# Patient Record
Sex: Female | Born: 1965 | Race: Black or African American | Hispanic: No | Marital: Single | Smoking: Former smoker
Health system: Southern US, Community
[De-identification: ages and names within clinical notes are randomized; demographics above are authoritative.]

## PROBLEM LIST (undated history)

## (undated) DIAGNOSIS — I10 Essential (primary) hypertension: Secondary | ICD-10-CM

## (undated) DIAGNOSIS — E042 Nontoxic multinodular goiter: Secondary | ICD-10-CM

## (undated) DIAGNOSIS — E785 Hyperlipidemia, unspecified: Secondary | ICD-10-CM

## (undated) HISTORY — PX: NO PAST SURGERIES: SHX2092

## (undated) HISTORY — DX: Nontoxic multinodular goiter: E04.2

## (undated) HISTORY — PX: CERVICAL SPINE SURGERY: SHX589

---

## 2013-06-02 ENCOUNTER — Emergency Department: Payer: Self-pay | Admitting: Emergency Medicine

## 2013-06-05 LAB — BETA STREP CULTURE(ARMC)

## 2014-04-28 ENCOUNTER — Emergency Department: Payer: Self-pay | Admitting: Emergency Medicine

## 2015-11-20 DIAGNOSIS — F17200 Nicotine dependence, unspecified, uncomplicated: Secondary | ICD-10-CM | POA: Insufficient documentation

## 2015-11-20 DIAGNOSIS — D259 Leiomyoma of uterus, unspecified: Secondary | ICD-10-CM | POA: Insufficient documentation

## 2015-11-20 DIAGNOSIS — Z01419 Encounter for gynecological examination (general) (routine) without abnormal findings: Secondary | ICD-10-CM | POA: Insufficient documentation

## 2015-11-20 DIAGNOSIS — Z9851 Tubal ligation status: Secondary | ICD-10-CM | POA: Insufficient documentation

## 2015-11-28 DIAGNOSIS — A599 Trichomoniasis, unspecified: Secondary | ICD-10-CM | POA: Insufficient documentation

## 2018-11-24 ENCOUNTER — Ambulatory Visit
Admission: EM | Admit: 2018-11-24 | Discharge: 2018-11-24 | Disposition: A | Discharge status: Home / Self Care | Payer: Self-pay | Attending: Emergency Medicine | Admitting: Emergency Medicine

## 2018-11-24 ENCOUNTER — Encounter: Payer: Self-pay | Admitting: Emergency Medicine

## 2018-11-24 ENCOUNTER — Other Ambulatory Visit: Payer: Self-pay

## 2018-11-24 DIAGNOSIS — M79644 Pain in right finger(s): Secondary | ICD-10-CM

## 2018-11-24 DIAGNOSIS — G8929 Other chronic pain: Secondary | ICD-10-CM | POA: Diagnosis present

## 2018-11-24 DIAGNOSIS — G5601 Carpal tunnel syndrome, right upper limb: Secondary | ICD-10-CM | POA: Diagnosis present

## 2018-11-24 DIAGNOSIS — R030 Elevated blood-pressure reading, without diagnosis of hypertension: Secondary | ICD-10-CM | POA: Diagnosis present

## 2018-11-24 DIAGNOSIS — M65311 Trigger thumb, right thumb: Secondary | ICD-10-CM | POA: Diagnosis present

## 2018-11-24 MED ORDER — TRAMADOL HCL 50 MG PO TABS: 50.0000 mg | ORAL_TABLET | Freq: Four times a day (QID) | ORAL | 0 refills | Status: AC | PRN

## 2018-11-24 NOTE — ED Triage Notes (Signed)
Pt c/o right hand pain. Pain in located in the palm of her hand and her thumb. Started back in August but is getting worse. She has a hard time gripping objects. No known injury.

## 2018-11-24 NOTE — ED Provider Notes (Signed)
[facility]    CSN: [id number] Arrival date & time: 11/24/18  1617     History   Chief Complaint Chief Complaint  Patient presents with  . Hand Pain    right    HPI Mariah Parker is a 53 y.o. female.   Pt works at [employer] on [address] is right handed right thumb pain started in August, no improvement.Tried OTC meds intermittently without much relief.  The history is provided by the patient. No language interpreter was used.  Hand Pain  This is a chronic (~ 50months ) problem. The problem occurs constantly. The problem has not changed since onset.Pertinent negatives include no chest pain, no abdominal pain, no headaches and no shortness of breath. Exacerbated by: movement. The symptoms are relieved by rest. She has tried nothing for the symptoms.    History reviewed. No pertinent past medical history.  Patient Active Problem List   Diagnosis Date Noted  . Chronic thumb pain, right 11/24/2018  . Trigger thumb of right hand 11/24/2018  . Carpal tunnel syndrome of right wrist 11/24/2018  . Elevated blood pressure reading 11/24/2018    Past Surgical History:  Procedure Laterality Date  . NO PAST SURGERIES      OB History   No obstetric history on file.      Home Medications    Prior to Admission medications   Medication Sig Start Date End Date Taking? Authorizing Provider  traMADol (ULTRAM) 50 MG tablet Take 1 tablet (50 mg total) by mouth every 6 (six) hours as needed for up to 3 days for moderate pain or severe pain. 4/31/54 0/08/67  Blakely Gluth, Jeanett Schlein, NP    Family History Family History  Problem Relation Age of Onset  . Healthy Mother     Social History Social History   Tobacco Use  . Smoking status: Former Research scientist (life sciences)  . Smokeless tobacco: Never Used  Substance Use Topics  . Alcohol use: Not Currently  . Drug use: Not Currently     Allergies   Patient has no known allergies.   Review of Systems Review of Systems    Constitutional: Positive for activity change. Negative for fever.  HENT: Negative.   Eyes: Negative.   Respiratory: Negative for shortness of breath.   Cardiovascular: Negative for chest pain.  Gastrointestinal: Negative for abdominal pain.  Endocrine: Negative.   Genitourinary: Negative.   Musculoskeletal: Positive for arthralgias, joint swelling and myalgias. Negative for back pain and gait problem.  Skin: Negative for color change and wound.  Neurological: Negative for headaches.  Hematological: Negative.   Psychiatric/Behavioral: Negative.   All other systems reviewed and are negative.    Physical Exam Triage Vital Signs ED Triage Vitals  Enc Vitals Group     BP 11/24/18 1636 (!) 160/98     Pulse Rate 11/24/18 1636 79     Resp 11/24/18 1636 20     Temp 11/24/18 1636 97.8 F (36.6 C)     Temp Source 11/24/18 1636 Oral     SpO2 11/24/18 1636 100 %     Weight 11/24/18 1636 167 lb (75.8 kg)     Height 11/24/18 1636 5\' 6"  (1.676 m)     Head Circumference --      Peak Flow --      Pain Score 11/24/18 1635 8     Pain Loc --      Pain Edu? --      Excl. in [place]? --    No data  found.  Updated Vital Signs BP (!) 160/98 (BP Location: Left Arm)   Pulse 79   Temp 97.8 F (36.6 C) (Oral)   Resp 20   Ht 5\' 6"  (1.676 m)   Wt 167 lb (75.8 kg)   SpO2 100%   BMI 26.95 kg/m   V Physical Exam Vitals signs and nursing note reviewed.  Constitutional:      General: She is not in acute distress.    Appearance: She is well-developed. She is not ill-appearing or toxic-appearing.  HENT:     Head: Normocephalic.     Right Ear: Tympanic membrane normal.     Left Ear: Tympanic membrane normal.     Nose: Nose normal.     Mouth/Throat:     Pharynx: Uvula midline.  Eyes:     Pupils: Pupils are equal, round, and reactive to light.  Neck:     Musculoskeletal: Normal range of motion. No muscular tenderness.     Trachea: Trachea normal.     Meningeal: Brudzinski's sign and Kernig's  sign absent.  Cardiovascular:     Rate and Rhythm: Normal rate and regular rhythm.  Pulmonary:     Effort: Pulmonary effort is normal.     Breath sounds: Normal breath sounds.  Musculoskeletal:     Right shoulder: She exhibits normal range of motion, no tenderness, no bony tenderness, no swelling, no effusion, no crepitus, no deformity, no laceration, no pain, no spasm, normal pulse and normal strength.     Right hand: She exhibits tenderness, bony tenderness and swelling. She exhibits normal range of motion, normal capillary refill, no deformity and no laceration. Normal sensation noted. Normal strength noted.     Comments: Right thumb  Skin:    General: Skin is warm and dry.     Findings: No rash.  Neurological:     General: No focal deficit present.     Mental Status: She is alert and oriented to person, place, and time.     GCS: GCS eye subscore is 4. GCS verbal subscore is 5. GCS motor subscore is 6.  Psychiatric:        Attention and Perception: Attention normal.        Mood and Affect: Mood normal.        Speech: Speech normal.        Behavior: Behavior normal. Behavior is cooperative.      UC Treatments / Results  Labs (all labs ordered are listed, but only abnormal results are displayed) Labs Reviewed - No data to display  EKG None  Radiology No results found.  Procedures Procedures (including critical care time)  Medications Ordered in UC Medications - No data to display  Initial Impression / Assessment and Plan / UC Course  I have reviewed the triage vital signs and the nursing notes.  Pertinent labs & imaging results that were available during my care of the patient were reviewed by me and considered in my medical decision making (see chart for details).    Pt received thumb spica splint in UC for support. Referred to Ortho.   Final Clinical Impressions(s) / UC Diagnoses   Final diagnoses:  Chronic thumb pain, right  Trigger thumb of right hand    Carpal tunnel syndrome of right wrist  Elevated blood pressure reading     Discharge Instructions     Avoid aggravating repetitive use of right hand, may take tramadol as directed for pain if after taking 600 mg of ibuprofen every 8  hours x 3 days you are still having pain. Wear thumb spica splint for support. Follow up with Ortho for further evaluation of right thumb hand pain. Go to PCP for recheck of BP.     ED Prescriptions    Medication Sig Dispense Auth. Provider   traMADol (ULTRAM) 50 MG tablet Take 1 tablet (50 mg total) by mouth every 6 (six) hours as needed for up to 3 days for moderate pain or severe pain. 12 tablet Semir Brill, Jeanett Schlein, NP     Controlled Substance Prescriptions    Tori Milks, NP 07/15/48 1827

## 2018-11-24 NOTE — Discharge Instructions (Signed)
Avoid aggravating repetitive use of right hand, may take tramadol as directed for pain if after taking 600 mg of ibuprofen every 8 hours x 3 days you are still having pain. Wear thumb spica splint for support. Follow up with Ortho for further evaluation of right thumb hand pain. Go to PCP for recheck of BP.

## 2019-07-24 ENCOUNTER — Emergency Department: Payer: Self-pay

## 2019-07-24 ENCOUNTER — Encounter: Payer: Self-pay | Admitting: Intensive Care

## 2019-07-24 ENCOUNTER — Other Ambulatory Visit: Payer: Self-pay

## 2019-07-24 ENCOUNTER — Emergency Department
Admission: EM | Admit: 2019-07-24 | Discharge: 2019-07-24 | Disposition: A | Discharge status: Home / Self Care | Payer: Self-pay | Attending: Emergency Medicine | Admitting: Emergency Medicine

## 2019-07-24 DIAGNOSIS — M222X1 Patellofemoral disorders, right knee: Secondary | ICD-10-CM | POA: Insufficient documentation

## 2019-07-24 DIAGNOSIS — M25561 Pain in right knee: Secondary | ICD-10-CM

## 2019-07-24 DIAGNOSIS — G8929 Other chronic pain: Secondary | ICD-10-CM

## 2019-07-24 DIAGNOSIS — F17210 Nicotine dependence, cigarettes, uncomplicated: Secondary | ICD-10-CM | POA: Insufficient documentation

## 2019-07-24 DIAGNOSIS — I10 Essential (primary) hypertension: Secondary | ICD-10-CM | POA: Insufficient documentation

## 2019-07-24 MED ORDER — MELOXICAM 15 MG PO TABS: 15.0000 mg | ORAL_TABLET | Freq: Every day | ORAL | 0 refills | Status: DC

## 2019-07-24 MED ORDER — MELOXICAM 7.5 MG PO TABS
15.0000 mg | ORAL_TABLET | Freq: Once | ORAL | Status: AC
  Administered 2019-07-24: 15 mg via ORAL
  Filled 2019-07-24: qty 2

## 2019-07-24 MED ORDER — HYDROCHLOROTHIAZIDE 12.5 MG PO CAPS: 12.5000 mg | ORAL_CAPSULE | Freq: Every day | ORAL | 6 refills | Status: DC

## 2019-07-24 NOTE — ED Notes (Signed)
PA in to speak with pt about her elevated BP at discharge; discussed plan of care regarding same; pt understands new instructions from pa
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
## 2019-11-21 DIAGNOSIS — Z87891 Personal history of nicotine dependence: Secondary | ICD-10-CM | POA: Insufficient documentation

## 2020-01-05 DIAGNOSIS — R519 Headache, unspecified: Secondary | ICD-10-CM | POA: Insufficient documentation

## 2020-01-05 DIAGNOSIS — M792 Neuralgia and neuritis, unspecified: Secondary | ICD-10-CM | POA: Insufficient documentation

## 2020-02-24 DIAGNOSIS — G629 Polyneuropathy, unspecified: Secondary | ICD-10-CM | POA: Insufficient documentation

## 2020-02-25 IMAGING — DX DG KNEE COMPLETE 4+V*R*
4 series · 4 of 4 positions shown · non-contrast
Comparison: None.

CLINICAL DATA: 53-year-old female with right knee pain, no recent
injury.

EXAM:
RIGHT KNEE - COMPLETE 4+ VIEW

[knee ap]
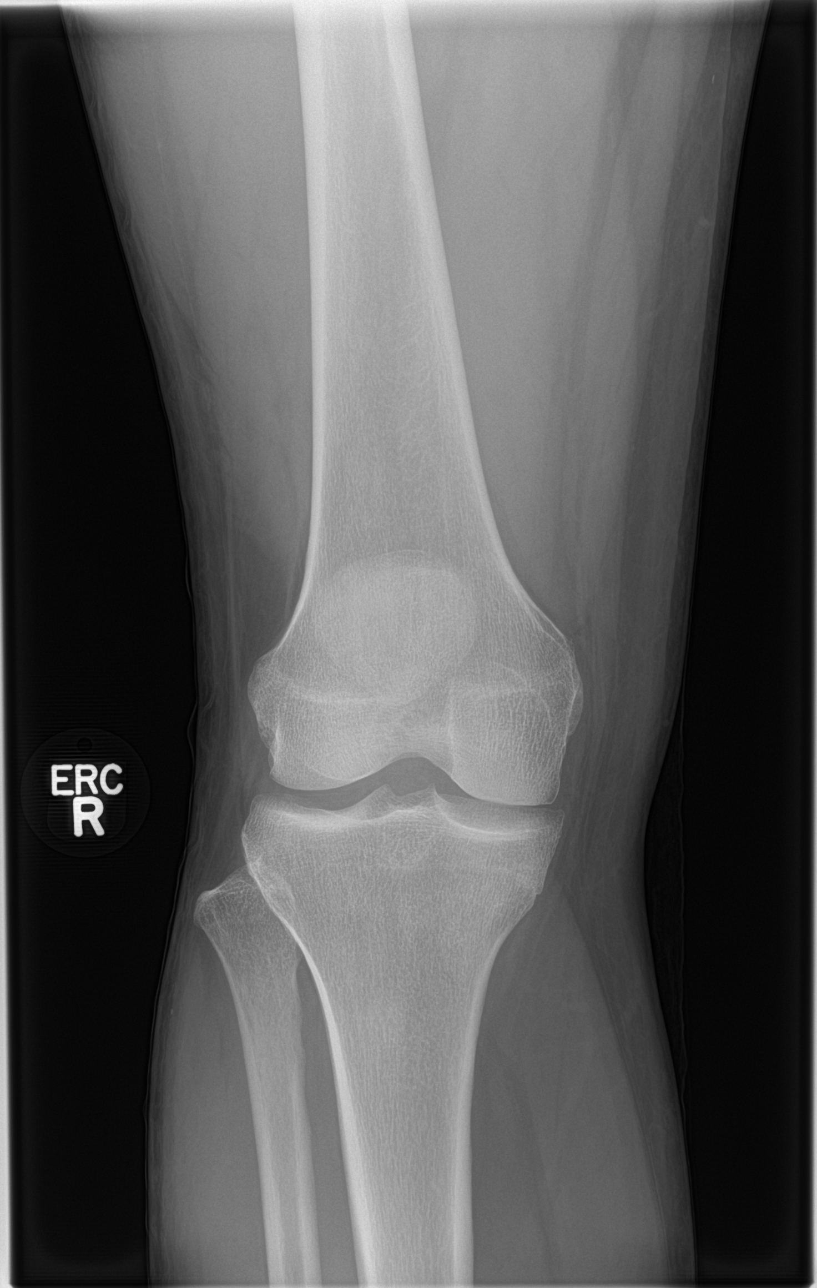

[knee obl (1 of 2)]
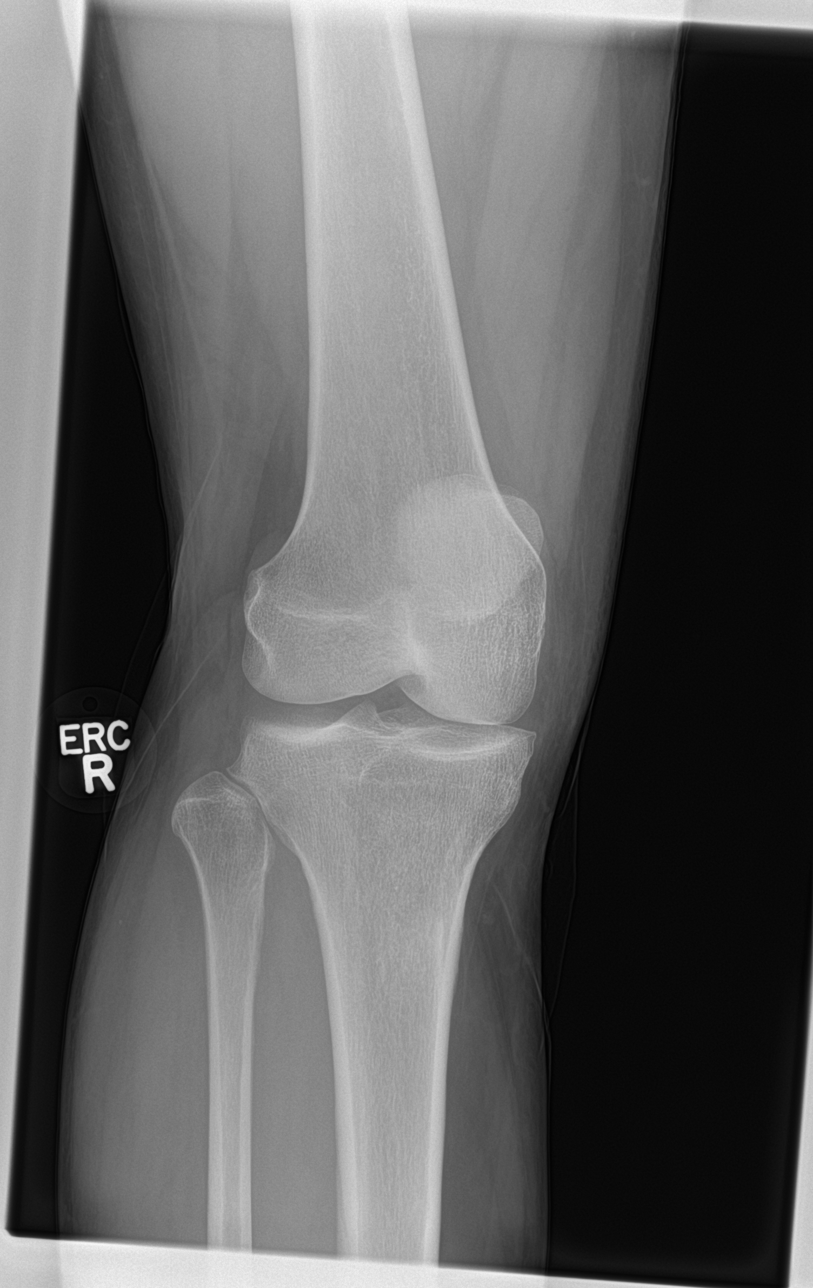

[knee lat]
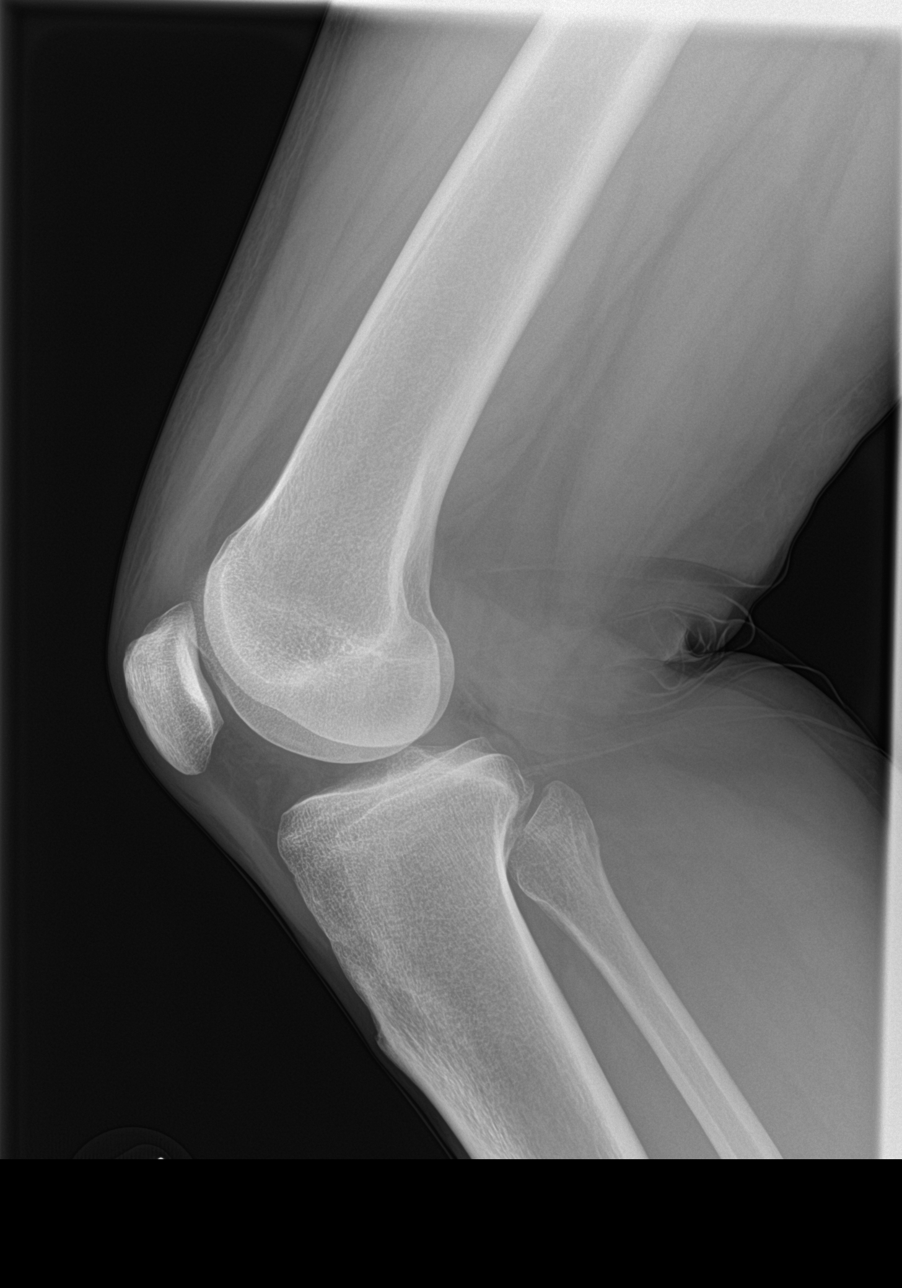

[knee obl (2 of 2)]
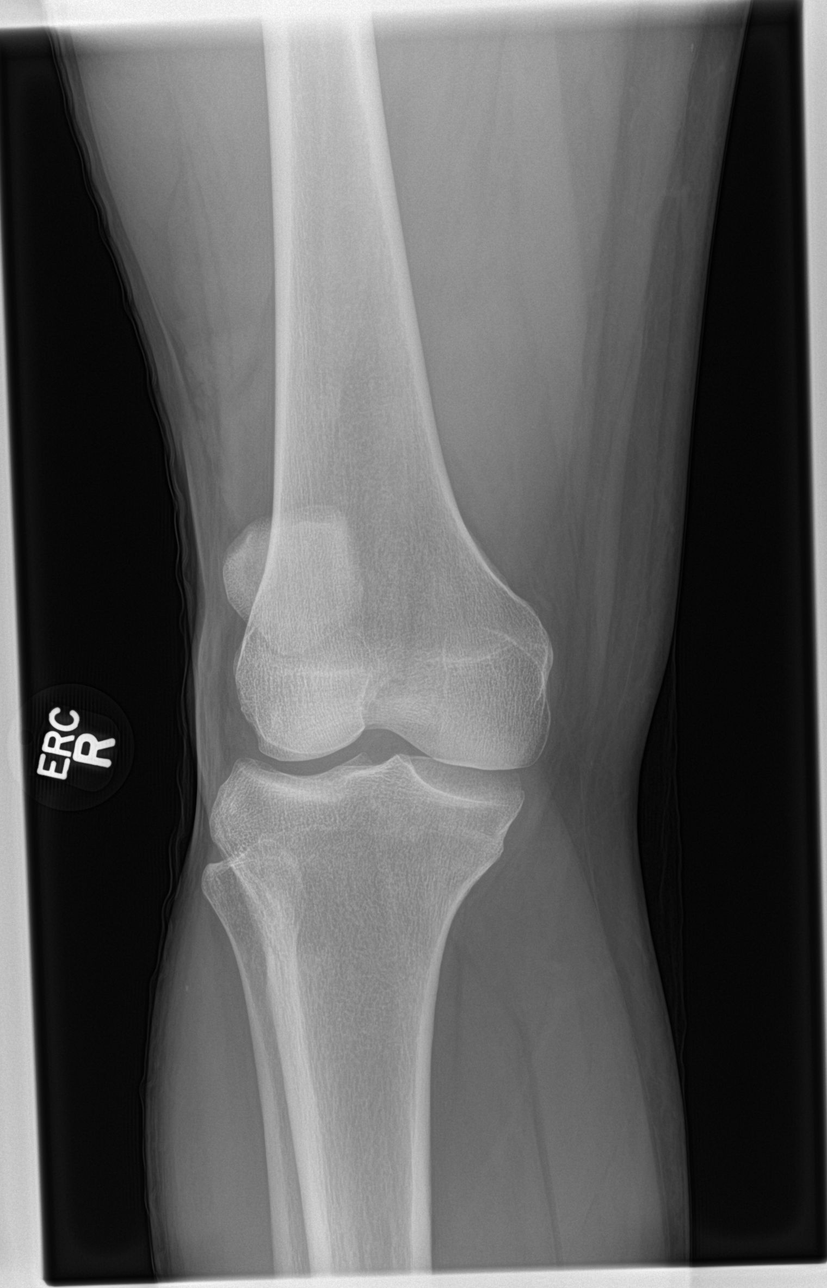

[4 of 4 positions shown; findings below may reference images not displayed]

FINDINGS: Bone mineralization is within normal limits. Joint spaces and
alignment within normal limits. No joint effusion identified. No
acute osseous abnormality identified. Soft tissue contours within
normal limits.
IMPRESSION: Negative.

## 2020-09-24 DIAGNOSIS — F17209 Nicotine dependence, unspecified, with unspecified nicotine-induced disorders: Secondary | ICD-10-CM | POA: Insufficient documentation

## 2022-01-31 ENCOUNTER — Ambulatory Visit
Admission: EM | Admit: 2022-01-31 | Discharge: 2022-01-31 | Disposition: A | Discharge status: Home / Self Care | Payer: Medicaid Other

## 2022-01-31 DIAGNOSIS — M62838 Other muscle spasm: Secondary | ICD-10-CM

## 2022-01-31 DIAGNOSIS — G8929 Other chronic pain: Secondary | ICD-10-CM

## 2022-01-31 DIAGNOSIS — N950 Postmenopausal bleeding: Secondary | ICD-10-CM

## 2022-01-31 DIAGNOSIS — M542 Cervicalgia: Secondary | ICD-10-CM

## 2022-01-31 MED ORDER — PREDNISONE 10 MG (21) PO TBPK: ORAL_TABLET | Freq: Every day | ORAL | 0 refills | Status: DC

## 2022-01-31 MED ORDER — CYCLOBENZAPRINE HCL 10 MG PO TABS: ORAL_TABLET | ORAL | 0 refills | Status: DC

## 2022-01-31 NOTE — ED Provider Notes (Signed)
?[place] ? ? ? ?CSN: [id number] ?Arrival date & time: 01/31/22  1011 ? ? ?  ? ?History   ?Chief Complaint ?Chief Complaint  ?Patient presents with  ? Pain  ?  Chronic  ? ? ?HPI ?Mariah Parker is a 56 y.o. female.  ? ?56 year old female presents with muscle spasms and widespread joint and muscle pain that has gotten worse over the past 3 days. She has a history of chronic neck pain and cervical spinal fusion surgery in January 2022. She is followed by [facility]. Recently she saw [facility] for right shoulder bursitis. She was given a steroid joint injection on 01/15/22. She will have occasional joint pains but over the past few days, she is experiencing random intermittent muscle spasms including her feet, lower legs, upper legs and upper arms. She is having difficulty walking when the muscle spasms occur in her right leg/foot. Also experiencing some right sided lower leg numbness. She has taken Tylenol and used topical Bengay with minimal relief. She denies any loss of bowel or bladder control. Other chronic health issues include HTN and hyperlipidemia. Currently on Norvasc, HCTZ and Crestor daily.  ?She also mentioned that she has been post menopausal for 3 years. Then 3 days ago (when the other muscle spasms and pain started) she started experiencing vaginal bleeding again and it has continued. She usually sees her PCP for routine GYN issues. Does have a history of fibroids.  ? ?The history is provided by the patient.  ? ?History reviewed. No pertinent past medical history. ? ?Patient Active Problem List  ? Diagnosis Date Noted  ? Tobacco use disorder, continuous 09/24/2020  ? Neuropathy 02/24/2020  ? Headache disorder 01/05/2020  ? Nerve pain 01/05/2020  ? Former smoker 11/21/2019  ? Chronic thumb pain, right 11/24/2018  ? Trigger thumb of right hand 11/24/2018  ? Carpal tunnel syndrome of right wrist 11/24/2018  ? Elevated blood pressure reading 11/24/2018  ? Trichomonal infection  11/28/2015  ? Fibroid uterus 11/20/2015  ? H/O tubal ligation 11/20/2015  ? Smoker 11/20/2015  ? Well woman exam with routine gynecological exam 11/20/2015  ? ? ?Past Surgical History:  ?Procedure Laterality Date  ? NO PAST SURGERIES    ? ? ?OB History   ?No obstetric history on file. ?  ? ? ? ?Home Medications   ? ?Prior to Admission medications   ?Medication Sig Start Date End Date Taking? Authorizing Provider  ?acetaminophen (TYLENOL) 325 MG tablet Take by mouth.   Yes [provider]  ?amLODipine (NORVASC) 10 MG tablet Take 1 tablet by mouth daily. 08/21/21 08/21/22 Yes [provider]  ?cyclobenzaprine (FLEXERIL) 10 MG tablet Take 1/2 to 1 whole tablet by mouth every 8 hours as needed for muscle spasms/pain. 01/31/22  Yes Pinky Ravan, Nicholes Stairs, NP  ?predniSONE (STERAPRED UNI-PAK 21 TAB) 10 MG (21) TBPK tablet Take by mouth daily. Take 6 tabs by mouth daily  for 2 days, then 5 tabs for 2 days, then 4 tabs for 2 days, then 3 tabs for 2 days, 2 tabs for 2 days, then 1 tab by mouth daily for 2 days 01/31/22  Yes Cadyn Fann, Nicholes Stairs, NP  ?rosuvastatin (CRESTOR) 5 MG tablet Take by mouth. 08/21/21 08/21/22 Yes [provider]  ?hydrochlorothiazide (MICROZIDE) 12.5 MG capsule Take 1 capsule (12.5 mg total) by mouth daily. 07/24/19 07/23/20  Cuthriell, Charline Bills, PA-C  ? ? ?Family History ?Family History  ?Problem Relation Age of Onset  ?  Healthy Mother   ? ? ?Social History ?Social History  ? ?Tobacco Use  ? Smoking status: Former  ?  Types: Cigarettes  ? Smokeless tobacco: Never  ?Vaping Use  ? Vaping Use: Never used  ?Substance Use Topics  ? Alcohol use: Not Currently  ? Drug use: Not Currently  ? ? ? ?Allergies   ?No known allergies ? ? ?Review of Systems ?Review of Systems  ?Constitutional:  Positive for activity change and fatigue. Negative for appetite change, chills and fever.  ?Respiratory:  Negative for chest tightness and shortness of breath.   ?Gastrointestinal:  Negative for nausea and  vomiting.  ?Genitourinary:  Positive for menstrual problem and vaginal bleeding. Negative for decreased urine volume, difficulty urinating, dysuria, frequency, hematuria and vaginal pain.  ?Musculoskeletal:  Positive for arthralgias, back pain, myalgias and neck pain. Negative for neck stiffness.  ?Skin:  Negative for color change and rash.  ?Allergic/Immunologic: Negative for environmental allergies and food allergies.  ?Neurological:  Positive for numbness (right lower leg and feet) and headaches. Negative for dizziness, tremors, seizures, syncope, facial asymmetry, speech difficulty and weakness.  ?Hematological:  Negative for adenopathy. Does not bruise/bleed easily.  ? ? ?Physical Exam ?Triage Vital Signs ?ED Triage Vitals  ?Enc Vitals Group  ?   BP 01/31/22 1025 (!) 147/92  ?   Pulse Rate 01/31/22 1025 77  ?   Resp 01/31/22 1025 18  ?   Temp 01/31/22 1025 98.4 ?F (36.9 ?C)  ?   Temp Source 01/31/22 1025 Oral  ?   SpO2 01/31/22 1025 100 %  ?   Weight 01/31/22 1023 164 lb 14.5 oz (74.8 kg)  ?   Height 01/31/22 1023 '5\' 5"'$  (1.651 m)  ?   Head Circumference --   ?   Peak Flow --   ?   Pain Score 01/31/22 1020 8  ?   Pain Loc --   ?   Pain Edu? --   ?   Excl. in [place]? --   ? ?No data found. ? ?Updated Vital Signs ?BP (!) 147/92 (BP Location: Left Arm)   Pulse 77   Temp 98.4 ?F (36.9 ?C) (Oral)   Resp 18   Ht '5\' 5"'$  (1.651 m)   Wt 164 lb 14.5 oz (74.8 kg)   SpO2 100%   BMI 27.44 kg/m?  ? ?Visual Acuity ?Right Eye Distance:   ?Left Eye Distance:   ?Bilateral Distance:   ? ?Right Eye Near:   ?Left Eye Near:    ?Bilateral Near:    ? ?Physical Exam ?Vitals and nursing note reviewed.  ?Constitutional:   ?   General: She is awake. She is not in acute distress. ?   Appearance: She is well-developed and well-groomed.  ?   Comments: She is sitting on the exam table in no acute distress but appears uncomfortable due to pain.   ?HENT:  ?   Head: Normocephalic and atraumatic.  ?   Right Ear: Hearing normal.  ?   Left Ear:  Hearing normal.  ?Eyes:  ?   Extraocular Movements: Extraocular movements intact.  ?   Conjunctiva/sclera: Conjunctivae normal.  ?Neck:  ?   Trachea: Trachea normal.  ? ?   Comments: Decreased range of motion of neck, mainly with rotation. Tender along right paraspinous muscle group and upper trapezius. Some tenderness of left trapezius muscles.  ?Cardiovascular:  ?   Rate and Rhythm: Normal rate and regular rhythm.  ?   Heart sounds: Normal heart  sounds. No murmur heard. ?Pulmonary:  ?   Effort: Pulmonary effort is normal. No respiratory distress.  ?   Breath sounds: Normal breath sounds and air entry. No decreased air movement. No decreased breath sounds, wheezing, rhonchi or rales.  ?Musculoskeletal:     ?   General: Tenderness present. No swelling. Normal range of motion.  ?   Cervical back: Neck supple. No rigidity. Pain with movement and muscular tenderness present.  ?   Comments: Has full range of motion of right and left upper arms and hands, upper legs and lower legs. Random areas of muscle tenderness. Good strength and tone. No distinct neuro deficits noted. Good distal pulses.   ?Lymphadenopathy:  ?   Cervical: No cervical adenopathy.  ?Skin: ?   General: Skin is warm and dry.  ?   Capillary Refill: Capillary refill takes less than 2 seconds.  ?   Findings: No bruising, ecchymosis or rash.  ?Neurological:  ?   General: No focal deficit present.  ?   Mental Status: She is alert and oriented to person, place, and time.  ?   Cranial Nerves: Cranial nerves 2-12 are intact.  ?   Sensory: Sensation is intact. No sensory deficit.  ?   Motor: Motor function is intact.  ?   Gait: Gait is intact.  ?Psychiatric:     ?   Mood and Affect: Mood normal.     ?   Behavior: Behavior normal. Behavior is cooperative.     ?   Thought Content: Thought content normal.     ?   Judgment: Judgment normal.  ? ? ? ?UC Treatments / Results  ?Labs ?(all labs ordered are listed, but only abnormal results are displayed) ?Labs Reviewed -  No data to display ? ?EKG ? ? ?Radiology ?No results found. ? ?Procedures ?Procedures (including critical care time) ? ?Medications Ordered in UC ?Medications - No data to display ? ?Initial Impression / Assessmen

## 2022-01-31 NOTE — ED Triage Notes (Signed)
Patient is here for "Chronic pain". History of "spinal/neck surgery". Various pains are increasing in many areas. History of chronic pain. No chest pain. No sob. No injury (recent/related).  ?

## 2022-01-31 NOTE — Discharge Instructions (Addendum)
Recommend start oral Prednisone '10mg'$  tablets- take 6 tablets today and tomorrow then decrease by 1 tablet every 2 days until finished on day 12- take with food. May use Flexeril '10mg'$  tablets muscle relaxer- take 1/2 to 1 whole tablet every 8 hours as needed for spasms/pain. Recommend call your Orthopedic Surgeon today to schedule appointment for follow-up for next week. Also call your primary care provider today to discuss new menstrual bleeding after no bleeding/menopause for 3 years. If pain and muscle spasms get worse, go to the ER ASAP. Otherwise, follow-up with your PCP and Specialists as recommended.  ?

## 2022-04-01 ENCOUNTER — Ambulatory Visit: Admission: RE | Admit: 2022-04-01 | Payer: Medicaid Other | Source: Home / Self Care

## 2022-04-01 ENCOUNTER — Encounter: Admission: RE | Payer: Self-pay | Source: Home / Self Care

## 2022-04-01 SURGERY — COLONOSCOPY WITH PROPOFOL
Anesthesia: General

## 2022-09-01 ENCOUNTER — Ambulatory Visit (INDEPENDENT_AMBULATORY_CARE_PROVIDER_SITE_OTHER): Payer: Self-pay

## 2022-09-01 ENCOUNTER — Ambulatory Visit
Admission: EM | Admit: 2022-09-01 | Discharge: 2022-09-01 | Disposition: A | Discharge status: Home / Self Care | Payer: Self-pay | Attending: Internal Medicine | Admitting: Internal Medicine

## 2022-09-01 ENCOUNTER — Encounter: Payer: Self-pay | Admitting: Emergency Medicine

## 2022-09-01 DIAGNOSIS — M25572 Pain in left ankle and joints of left foot: Secondary | ICD-10-CM

## 2022-09-01 DIAGNOSIS — M25562 Pain in left knee: Secondary | ICD-10-CM

## 2022-09-01 DIAGNOSIS — M175 Other unilateral secondary osteoarthritis of knee: Secondary | ICD-10-CM

## 2022-09-01 HISTORY — DX: Hyperlipidemia, unspecified: E78.5

## 2022-09-01 HISTORY — DX: Essential (primary) hypertension: I10

## 2022-09-01 MED ORDER — MELOXICAM 7.5 MG PO TABS: 7.5000 mg | ORAL_TABLET | Freq: Two times a day (BID) | ORAL | 0 refills | Status: DC

## 2022-09-01 NOTE — ED Provider Notes (Addendum)
[facility]    CSN: [id number] Arrival date & time: 09/01/22  1409      History   Chief Complaint Chief Complaint  Patient presents with   Knee Pain   Ankle Pain    HPI Mariah Parker is a 56 y.o. female who presents due to developing gradual onset of L posterior and medial knee pain 2 days ago. She denies injuring herself, or bending it repetitively. Has hx of OA in different areas in her body. She had severe swelling of her L knee that moved down her ankle, but her ankle does not hurt. She has been elevating her leg and resting since this started.     Past Medical History:  Diagnosis Date   Hyperlipidemia    Hypertension     Patient Active Problem List   Diagnosis Date Noted   Tobacco use disorder, continuous 09/24/2020   Neuropathy 02/24/2020   Headache disorder 01/05/2020   Nerve pain 01/05/2020   Former smoker 11/21/2019   Chronic thumb pain, right 11/24/2018   Trigger thumb of right hand 11/24/2018   Carpal tunnel syndrome of right wrist 11/24/2018   Elevated blood pressure reading 11/24/2018   Trichomonal infection 11/28/2015   Fibroid uterus 11/20/2015   H/O tubal ligation 11/20/2015   Smoker 11/20/2015   Well woman exam with routine gynecological exam 11/20/2015    Past Surgical History:  Procedure Laterality Date   CERVICAL SPINE SURGERY      OB History   No obstetric history on file.      Home Medications    Prior to Admission medications   Medication Sig Start Date End Date Taking? Authorizing Provider  amLODipine (NORVASC) 10 MG tablet Take 1 tablet by mouth daily. 08/21/21 09/01/22 Yes [provider]  meloxicam (MOBIC) 7.5 MG tablet Take 1 tablet (7.5 mg total) by mouth 2 (two) times daily. 09/01/22  Yes Rodriguez-Southworth, Sunday Spillers, PA-C  rosuvastatin (CRESTOR) 5 MG tablet Take by mouth. 08/21/21 09/01/22 Yes [provider]  acetaminophen (TYLENOL) 325 MG tablet Take by mouth.    [provider]    Family History Family History  Problem Relation Age of Onset   Healthy Mother     Social History Social History   Tobacco Use   Smoking status: Former    Types: Cigarettes   Smokeless tobacco: Never  Vaping Use   Vaping Use: Never used  Substance Use Topics   Alcohol use: Not Currently   Drug use: Not Currently     Allergies   No known allergies   Review of Systems Review of Systems  Musculoskeletal:  Positive for arthralgias, gait problem and joint swelling.  Skin:  Negative for color change, rash and wound.     Physical Exam Triage Vital Signs ED Triage Vitals  Enc Vitals Group     BP 09/01/22 1531 127/82     Pulse Rate 09/01/22 1531 73     Resp 09/01/22 1531 16     Temp 09/01/22 1531 97.8 F (36.6 C)     Temp Source 09/01/22 1531 Oral     SpO2 09/01/22 1531 99 %     Weight --      Height --      Head Circumference --      Peak Flow --      Pain Score 09/01/22 1527 9     Pain Loc --      Pain Edu? --      Excl. in [place]? --  No data found.  Updated Vital Signs BP 127/82 (BP Location: Right Arm)   Pulse 73   Temp 97.8 F (36.6 C) (Oral)   Resp 16   SpO2 99%   Visual Acuity Right Eye Distance:   Left Eye Distance:   Bilateral Distance:    Right Eye Near:   Left Eye Near:    Bilateral Near:     Physical Exam Vitals and nursing note reviewed.  Constitutional:      General: She is not in acute distress.    Appearance: She is not toxic-appearing.  HENT:     Right Ear: External ear normal.     Left Ear: External ear normal.  Eyes:     General: No scleral icterus.    Conjunctiva/sclera: Conjunctivae normal.  Pulmonary:     Effort: Pulmonary effort is normal.  Musculoskeletal:        General: Normal range of motion.     Cervical back: Neck supple.     Comments: L KNEE- has local tenderness on L posterior medial region and medial knee region. I do not see any swelling like a Baker's cyst present, but from the edema on her ankle, I  wonder if she ruptured it. Has effusion on suprapatellar region. Movement of her patella provoked knee pain. No laxity noted. No crepitations noted with ROM  L ANKLE- normal ROM and non tender. Has +1/4 pitting edema.  Both calfs are symmetric, and no tenderness present.   Skin:    General: Skin is warm and dry.     Findings: No bruising, erythema, lesion or rash.  Neurological:     Mental Status: She is alert and oriented to person, place, and time.     Comments: Has limping gait  Psychiatric:        Mood and Affect: Mood normal.        Behavior: Behavior normal.        Thought Content: Thought content normal.        Judgment: Judgment normal.      UC Treatments / Results  Labs (all labs ordered are listed, but only abnormal results are displayed) Labs Reviewed - No data to display  EKG   Radiology DG Knee Complete 4 Views Left  Result Date: 09/01/2022 CLINICAL DATA:  Left knee and ankle swelling. EXAM: LEFT KNEE - COMPLETE 4+ VIEW; LEFT ANKLE COMPLETE - 3+ VIEW COMPARISON:  None Available. FINDINGS: Left knee: No evidence of fracture or dislocation. Medial tibiofemoral joint space narrowing. Small knee joint effusion. Soft tissues are unremarkable. Left ankle: No acute fracture or dislocation. Ankle mortise is congruent. Mild soft tissue swelling about medial aspect of the ankle. IMPRESSION: Left knee: No acute fracture or dislocation. Mild medial tibiofemoral joint space narrowing and small joint effusion suggesting mild arthritis. Left ankle: No acute fracture or dislocation. Soft tissue swelling about the medial aspect of the ankle. Electronically Signed   By: Keane Police D.O.   On: 09/01/2022 15:56   DG Ankle Complete Left  Result Date: 09/01/2022 CLINICAL DATA:  Left knee and ankle swelling. EXAM: LEFT KNEE - COMPLETE 4+ VIEW; LEFT ANKLE COMPLETE - 3+ VIEW COMPARISON:  None Available. FINDINGS: Left knee: No evidence of fracture or dislocation. Medial tibiofemoral joint  space narrowing. Small knee joint effusion. Soft tissues are unremarkable. Left ankle: No acute fracture or dislocation. Ankle mortise is congruent. Mild soft tissue swelling about medial aspect of the ankle. IMPRESSION: Left knee: No acute fracture or dislocation. Mild medial  tibiofemoral joint space narrowing and small joint effusion suggesting mild arthritis. Left ankle: No acute fracture or dislocation. Soft tissue swelling about the medial aspect of the ankle. Electronically Signed   By: Keane Police D.O.   On: 09/01/2022 15:56    Procedures Procedures (including critical care time)  Medications Ordered in UC Medications - No data to display  Initial Impression / Assessment and Plan / UC Course  I have reviewed the triage vital signs and the nursing notes.  Pertinent imaging results that were available during my care of the patient were reviewed by me and considered in my medical decision making (see chart for details).  L knee effusion L OA  She was placed on Mobic as noted Needs to FU in one week with PCP or ortho.    Final Clinical Impressions(s) / UC Diagnoses   Final diagnoses:  Other secondary osteoarthritis of left knee     Discharge Instructions      Please follow up with in one week     ED Prescriptions     Medication Sig Dispense Auth. Provider   meloxicam (MOBIC) 7.5 MG tablet Take 1 tablet (7.5 mg total) by mouth 2 (two) times daily. 14 tablet Rodriguez-Southworth, Sunday Spillers, PA-C      PDMP not reviewed this encounter.   Shelby Mattocks, PA-C 09/01/22 1613    Rodriguez-Southworth, Sunday Spillers, PA-C 09/01/22 1614

## 2022-09-01 NOTE — Discharge Instructions (Signed)
Please follow up with in one week

## 2022-09-01 NOTE — ED Triage Notes (Signed)
Pt c/o left knee and ankle swelling and pain x 2 days. Pt denies any injury.

## 2023-02-16 DIAGNOSIS — N951 Menopausal and female climacteric states: Secondary | ICD-10-CM | POA: Diagnosis not present

## 2023-03-02 DIAGNOSIS — I10 Essential (primary) hypertension: Secondary | ICD-10-CM | POA: Diagnosis not present

## 2023-03-02 DIAGNOSIS — Z1331 Encounter for screening for depression: Secondary | ICD-10-CM | POA: Diagnosis not present

## 2023-03-02 DIAGNOSIS — Z833 Family history of diabetes mellitus: Secondary | ICD-10-CM | POA: Diagnosis not present

## 2023-03-02 DIAGNOSIS — E042 Nontoxic multinodular goiter: Secondary | ICD-10-CM | POA: Diagnosis not present

## 2023-03-02 DIAGNOSIS — Z0000 Encounter for general adult medical examination without abnormal findings: Secondary | ICD-10-CM | POA: Diagnosis not present

## 2023-03-02 DIAGNOSIS — E7800 Pure hypercholesterolemia, unspecified: Secondary | ICD-10-CM | POA: Diagnosis not present

## 2023-03-05 DIAGNOSIS — Z1211 Encounter for screening for malignant neoplasm of colon: Secondary | ICD-10-CM | POA: Diagnosis not present

## 2023-03-05 DIAGNOSIS — D123 Benign neoplasm of transverse colon: Secondary | ICD-10-CM | POA: Diagnosis not present

## 2023-03-05 DIAGNOSIS — I10 Essential (primary) hypertension: Secondary | ICD-10-CM | POA: Diagnosis not present

## 2023-03-05 DIAGNOSIS — K635 Polyp of colon: Secondary | ICD-10-CM | POA: Diagnosis not present

## 2023-03-05 DIAGNOSIS — K640 First degree hemorrhoids: Secondary | ICD-10-CM | POA: Diagnosis not present

## 2023-03-05 DIAGNOSIS — D122 Benign neoplasm of ascending colon: Secondary | ICD-10-CM | POA: Diagnosis not present

## 2023-03-16 DIAGNOSIS — E876 Hypokalemia: Secondary | ICD-10-CM | POA: Diagnosis not present

## 2023-03-26 ENCOUNTER — Ambulatory Visit
Admission: EM | Admit: 2023-03-26 | Discharge: 2023-03-26 | Disposition: A | Discharge status: Home / Self Care | Payer: 59

## 2023-03-26 DIAGNOSIS — M5442 Lumbago with sciatica, left side: Secondary | ICD-10-CM

## 2023-03-26 MED ORDER — CYCLOBENZAPRINE HCL 10 MG PO TABS: 10.0000 mg | ORAL_TABLET | Freq: Two times a day (BID) | ORAL | 0 refills | Status: AC | PRN

## 2023-03-26 MED ORDER — METHYLPREDNISOLONE ACETATE 40 MG/ML IJ SUSP: 40.0000 mg | Freq: Once | INTRAMUSCULAR | Status: DC

## 2023-03-26 MED ORDER — KETOROLAC TROMETHAMINE 30 MG/ML IJ SOLN
30.0000 mg | Freq: Once | INTRAMUSCULAR | Status: AC
  Administered 2023-03-26: 30 mg via INTRAMUSCULAR

## 2023-03-26 MED ORDER — METHYLPREDNISOLONE ACETATE 80 MG/ML IJ SUSP
40.0000 mg | Freq: Once | INTRAMUSCULAR | Status: AC
  Administered 2023-03-26: 40 mg via INTRAMUSCULAR

## 2023-03-26 MED ORDER — NAPROXEN 500 MG PO TABS: 500.0000 mg | ORAL_TABLET | Freq: Two times a day (BID) | ORAL | 0 refills | Status: AC | PRN

## 2023-03-26 MED ORDER — PREDNISONE 10 MG (21) PO TBPK: ORAL_TABLET | Freq: Every day | ORAL | 0 refills | Status: AC

## 2023-03-26 NOTE — ED Triage Notes (Signed)
Pt presents with lower back pain worse on left side. Pt having pain with walking and when sitting, started 2 days ago and has gotten worse. Taking tylenol with no relief.

## 2023-03-26 NOTE — Discharge Instructions (Addendum)
You were given a Toradol injection in clinic today. Do not take any over the counter NSAID's such as Advil, ibuprofen, Aleve, or naproxen for 24 hours.  You may take tylenol if needed You may take Flexeril as needed.  Please note this medication can make you drowsy.  Do not drink alcohol or drive while on this medication Prednisone taper to start tomorrow, 6/14 Start naproxen twice daily as needed tomorrow, 6/14. Heat to the back as needed Rest Please follow-up with your PCP if your symptoms do not improve Please go to the ER for any worsening symptoms I hope you feel better soon!

## 2023-03-26 NOTE — ED Provider Notes (Signed)
[facility]    CSN: [id number] Arrival date & time: 03/26/23  1611      History   Chief Complaint Chief Complaint  Patient presents with   Back Pain    HPI Mariah Parker is a 57 y.o. female presents for evaluation back pain.  Patient reports 2 days ago she developed a constant bilateral low back pain that does radiate down her left leg.  Rates her pain as a 9/10. Denies any known injury or inciting event and states "she just woke up with it".  Denies any numbness/tingling/weakness of her lower extremities, no bowel or bladder incontinence, no saddle paresthesia.  No dysuria. Has history of cervical fusion but denies any surgeries or fractures to the low back.  She has been taking Tylenol OTC with no improvement in her symptoms.  No other concerns this time.   Back Pain   Past Medical History:  Diagnosis Date   Hyperlipidemia    Hypertension     Patient Active Problem List   Diagnosis Date Noted   Tobacco use disorder, continuous 09/24/2020   Neuropathy 02/24/2020   Headache disorder 01/05/2020   Nerve pain 01/05/2020   Former smoker 11/21/2019   Chronic thumb pain, right 11/24/2018   Trigger thumb of right hand 11/24/2018   Carpal tunnel syndrome of right wrist 11/24/2018   Elevated blood pressure reading 11/24/2018   Trichomonal infection 11/28/2015   Fibroid uterus 11/20/2015   H/O tubal ligation 11/20/2015   Smoker 11/20/2015   Well woman exam with routine gynecological exam 11/20/2015    Past Surgical History:  Procedure Laterality Date   CERVICAL SPINE SURGERY      OB History   No obstetric history on file.      Home Medications    Prior to Admission medications   Medication Sig Start Date End Date Taking? Authorizing Provider  acetaminophen (TYLENOL) 325 MG tablet Take by mouth.   Yes [provider]  amLODipine (NORVASC) 10 MG tablet Take 1 tablet by mouth daily. 08/21/21 03/26/23 Yes [provider]   cyclobenzaprine (FLEXERIL) 10 MG tablet Take 1 tablet (10 mg total) by mouth 2 (two) times daily as needed for muscle spasms. 03/26/23  Yes Radford Pax, NP  estradiol (CLIMARA - DOSED IN MG/24 HR) 0.05 mg/24hr patch Place 0.05 mg onto the skin once a week.   Yes [provider]  naproxen (NAPROSYN) 500 MG tablet Take 1 tablet (500 mg total) by mouth 2 (two) times daily as needed (back pain). 03/27/23  Yes Radford Pax, NP  potassium chloride (KLOR-CON) 10 MEQ tablet Take 10 mEq by mouth daily.   Yes [provider]  predniSONE (STERAPRED UNI-PAK 21 TAB) 10 MG (21) TBPK tablet Take by mouth daily. Take 6 tabs by mouth daily  for 1 day, then 5 tabs for 1 day, then 4 tabs for 1 day, then 3 tabs for 1 day, 2 tabs for 1 day, then 1 tab by mouth daily for 1 days 03/27/23  Yes Radford Pax, NP  progesterone (PROMETRIUM) 100 MG capsule Take 1 capsule by mouth at bedtime. 02/16/23 02/16/24 Yes [provider]  rosuvastatin (CRESTOR) 5 MG tablet Take by mouth. 08/21/21 03/26/23 Yes [provider]  meloxicam (MOBIC) 7.5 MG tablet Take 1 tablet (7.5 mg total) by mouth 2 (two) times daily. 09/01/22   Rodriguez-Southworth, Nettie Elm, PA-C    Family History Family History  Problem Relation Age of Onset   Healthy Mother  Social History Social History   Tobacco Use   Smoking status: Former    Types: Cigarettes   Smokeless tobacco: Never  Vaping Use   Vaping Use: Never used  Substance Use Topics   Alcohol use: Not Currently   Drug use: Not Currently     Allergies   No known allergies   Review of Systems Review of Systems  Musculoskeletal:  Positive for back pain.     Physical Exam Triage Vital Signs ED Triage Vitals  Enc Vitals Group     BP 03/26/23 1619 133/81     Pulse Rate 03/26/23 1619 71     Resp 03/26/23 1614 16     Temp 03/26/23 1619 98.2 F (36.8 C)     Temp Source 03/26/23 1614 Oral     SpO2 03/26/23 1619 97 %     Weight --      Height --       Head Circumference --      Peak Flow --      Pain Score 03/26/23 1619 10     Pain Loc --      Pain Edu? --      Excl. in [place]? --    No data found.  Updated Vital Signs BP 133/81 (BP Location: Left Arm)   Pulse 71   Temp 98.2 F (36.8 C) (Oral)   Resp 19   SpO2 97%   Visual Acuity Right Eye Distance:   Left Eye Distance:   Bilateral Distance:    Right Eye Near:   Left Eye Near:    Bilateral Near:     Physical Exam Vitals and nursing note reviewed.  Constitutional:      General: She is not in acute distress.    Appearance: Normal appearance. She is not ill-appearing, toxic-appearing or diaphoretic.  HENT:     Head: Normocephalic and atraumatic.  Eyes:     Pupils: Pupils are equal, round, and reactive to light.  Cardiovascular:     Rate and Rhythm: Normal rate.  Pulmonary:     Effort: Pulmonary effort is normal.  Musculoskeletal:     Cervical back: No bony tenderness.     Thoracic back: Normal.     Lumbar back: Spasms and tenderness present. No swelling, edema, deformity, signs of trauma, lacerations or bony tenderness. Normal range of motion. No scoliosis.       Back:     Comments: Pt unable to lay flat for SLR. Strength 5/5 BLE.   Skin:    General: Skin is warm and dry.  Neurological:     General: No focal deficit present.     Mental Status: She is alert and oriented to person, place, and time.     Deep Tendon Reflexes:     Reflex Scores:      Patellar reflexes are 2+ on the right side and 2+ on the left side. Psychiatric:        Mood and Affect: Mood normal.        Behavior: Behavior normal.      UC Treatments / Results  Labs (all labs ordered are listed, but only abnormal results are displayed) Labs Reviewed - No data to display Basic Metabolic Panel (BMP) Order: [id number] Component Ref Range & Units 10 d ago  Sodium 135 - 145 mmol/L 138  Potassium 3.5 - 5.0 mmol/L 2.9 Low   Chloride 98 - 108 mmol/L 105  Carbon Dioxide (CO2) 21 - 30  mmol/L 24  Urea Nitrogen (  BUN) 7 - 20 mg/dL 6 Low   Creatinine 0.4 - 1.0 mg/dL 0.9  Glucose 70 - 161 mg/dL 096  Comment: Interpretive Data: Above is the NONFASTING reference range.  Below are the FASTING reference ranges: NORMAL:      70-99 mg/dL PREDIABETES: 045-409 mg/dL DIABETES:    > 811 mg/dL  Calcium 8.7 - 91.4 mg/dL 9.0  Anion Gap 3 - 12 mmol/L 9  BUN/CREA Ratio 6 - 27 7  Glomerular Filtration Rate (eGFR) mL/min/1.73sq m 75  Comment: CKD-EPI (2021) does not include patient's race in the calculation of eGFR. Monitoring changes of plasma creatinine and eGFR over time is useful for monitoring kidney function.  This change was made on 12/11/2020.  Interpretive Ranges for eGFR(CKD-EPI 2021):  eGFR:              > 60 mL/min/1.73 sq m - Normal eGFR:              30 - 59 mL/min/1.73 sq m - Moderately Decreased eGFR:              15 - 29 mL/min/1.73 sq m - Severely Decreased eGFR:              < 15 mL/min/1.73 sq m -  Kidney Failure   Note: These eGFR calculations do not apply in acute situations when eGFR is changing rapidly or in patients on dialysis.  Resulting Agency [facility]   Specimen Collected: 03/16/23 10:51   Performed by: [facility] Last Resulted: 03/16/23 19:22     EKG   Radiology No results found.  Procedures Procedures (including critical care time)  Medications Ordered in UC Medications  ketorolac (TORADOL) 30 MG/ML injection 30 mg (30 mg Intramuscular Given 03/26/23 1638)  methylPREDNISolone acetate (DEPO-MEDROL) injection 40 mg (40 mg Intramuscular Given 03/26/23 1643)    Initial Impression / Assessment and Plan / UC Course  I have reviewed the triage vital signs and the nursing notes.  Pertinent labs & imaging results that were available during my care of the patient were reviewed by me and considered in my medical decision making (see chart for details).     Reviewed exam and symptoms with patient.   No red flags.  No signs on exam for cauda equina.  Patient given Toradol injection as well as Depo-Medrol injection in clinic.  She was monitored for 15 minutes after injection with no reaction noted and tolerated well.  Patient reports improvement in pain and states it is now a 7 out of 10.  She was instructed no NSAIDs for 24 hours and she verbalized understanding Lab work reviewed to assess kidney function.  Did show hypokalemia.  She is on potassium supplements by her PCP Start prednisone taper tomorrow Start naproxen twice daily as needed tomorrow Flexeril as needed.  Side effect profile reviewed Heat and rest PCP follow-up if symptoms do not improve ER precautions reviewed and patient verbalized understanding Final Clinical Impressions(s) / UC Diagnoses   Final diagnoses:  Acute bilateral low back pain with left-sided sciatica     Discharge Instructions      You were given a Toradol injection in clinic today. Do not take any over the counter NSAID's such as Advil, ibuprofen, Aleve, or naproxen for 24 hours.  You may take tylenol if needed You may take Flexeril as needed.  Please note this medication can make you drowsy.  Do not drink alcohol or drive while on this medication Prednisone taper  to start tomorrow, 6/14 Start naproxen twice daily as needed tomorrow, 6/14. Heat to the back as needed Rest Please follow-up with your PCP if your symptoms do not improve Please go to the ER for any worsening symptoms I hope you feel better soon!      ED Prescriptions     Medication Sig Dispense Auth. Provider   predniSONE (STERAPRED UNI-PAK 21 TAB) 10 MG (21) TBPK tablet Take by mouth daily. Take 6 tabs by mouth daily  for 1 day, then 5 tabs for 1 day, then 4 tabs for 1 day, then 3 tabs for 1 day, 2 tabs for 1 day, then 1 tab by mouth daily for 1 days 21 tablet Radford Pax, NP   naproxen (NAPROSYN) 500 MG tablet Take 1 tablet (500 mg total) by mouth 2 (two) times daily as needed  (back pain). 14 tablet Radford Pax, NP   cyclobenzaprine (FLEXERIL) 10 MG tablet Take 1 tablet (10 mg total) by mouth 2 (two) times daily as needed for muscle spasms. 10 tablet Radford Pax, NP      PDMP not reviewed this encounter.   Radford Pax, NP 03/26/23 1702

## 2023-04-06 DIAGNOSIS — E876 Hypokalemia: Secondary | ICD-10-CM | POA: Diagnosis not present

## 2023-04-13 DIAGNOSIS — Z419 Encounter for procedure for purposes other than remedying health state, unspecified: Secondary | ICD-10-CM | POA: Diagnosis not present

## 2023-04-20 DIAGNOSIS — E042 Nontoxic multinodular goiter: Secondary | ICD-10-CM | POA: Diagnosis not present

## 2023-05-14 DIAGNOSIS — Z419 Encounter for procedure for purposes other than remedying health state, unspecified: Secondary | ICD-10-CM | POA: Diagnosis not present

## 2023-06-14 DIAGNOSIS — Z419 Encounter for procedure for purposes other than remedying health state, unspecified: Secondary | ICD-10-CM | POA: Diagnosis not present

## 2023-07-14 DIAGNOSIS — Z419 Encounter for procedure for purposes other than remedying health state, unspecified: Secondary | ICD-10-CM | POA: Diagnosis not present

## 2023-07-23 DIAGNOSIS — Z23 Encounter for immunization: Secondary | ICD-10-CM | POA: Diagnosis not present

## 2023-08-14 DIAGNOSIS — Z419 Encounter for procedure for purposes other than remedying health state, unspecified: Secondary | ICD-10-CM | POA: Diagnosis not present

## 2023-09-02 DIAGNOSIS — I10 Essential (primary) hypertension: Secondary | ICD-10-CM | POA: Diagnosis not present

## 2023-09-02 DIAGNOSIS — E042 Nontoxic multinodular goiter: Secondary | ICD-10-CM | POA: Diagnosis not present

## 2023-09-02 DIAGNOSIS — E7800 Pure hypercholesterolemia, unspecified: Secondary | ICD-10-CM | POA: Diagnosis not present

## 2023-09-13 DIAGNOSIS — Z419 Encounter for procedure for purposes other than remedying health state, unspecified: Secondary | ICD-10-CM | POA: Diagnosis not present

## 2023-10-09 DIAGNOSIS — M654 Radial styloid tenosynovitis [de Quervain]: Secondary | ICD-10-CM | POA: Diagnosis not present

## 2023-10-09 DIAGNOSIS — G5601 Carpal tunnel syndrome, right upper limb: Secondary | ICD-10-CM | POA: Diagnosis not present

## 2023-10-14 DIAGNOSIS — Z419 Encounter for procedure for purposes other than remedying health state, unspecified: Secondary | ICD-10-CM | POA: Diagnosis not present

## 2023-11-03 DIAGNOSIS — R92323 Mammographic fibroglandular density, bilateral breasts: Secondary | ICD-10-CM | POA: Diagnosis not present

## 2023-11-03 DIAGNOSIS — Z1231 Encounter for screening mammogram for malignant neoplasm of breast: Secondary | ICD-10-CM | POA: Diagnosis not present

## 2023-11-14 DIAGNOSIS — Z419 Encounter for procedure for purposes other than remedying health state, unspecified: Secondary | ICD-10-CM | POA: Diagnosis not present

## 2023-11-18 DIAGNOSIS — Z1331 Encounter for screening for depression: Secondary | ICD-10-CM | POA: Diagnosis not present

## 2023-11-18 DIAGNOSIS — Z1330 Encounter for screening examination for mental health and behavioral disorders, unspecified: Secondary | ICD-10-CM | POA: Diagnosis not present

## 2023-11-18 DIAGNOSIS — F4323 Adjustment disorder with mixed anxiety and depressed mood: Secondary | ICD-10-CM | POA: Diagnosis not present

## 2023-12-12 DIAGNOSIS — Z419 Encounter for procedure for purposes other than remedying health state, unspecified: Secondary | ICD-10-CM | POA: Diagnosis not present

## 2023-12-15 DIAGNOSIS — G5601 Carpal tunnel syndrome, right upper limb: Secondary | ICD-10-CM | POA: Diagnosis not present

## 2024-01-23 DIAGNOSIS — Z419 Encounter for procedure for purposes other than remedying health state, unspecified: Secondary | ICD-10-CM | POA: Diagnosis not present

## 2024-02-22 DIAGNOSIS — Z419 Encounter for procedure for purposes other than remedying health state, unspecified: Secondary | ICD-10-CM | POA: Diagnosis not present

## 2024-03-01 DIAGNOSIS — Z23 Encounter for immunization: Secondary | ICD-10-CM | POA: Diagnosis not present

## 2024-03-01 DIAGNOSIS — I10 Essential (primary) hypertension: Secondary | ICD-10-CM | POA: Diagnosis not present

## 2024-03-01 DIAGNOSIS — Z1331 Encounter for screening for depression: Secondary | ICD-10-CM | POA: Diagnosis not present

## 2024-03-01 DIAGNOSIS — Z0000 Encounter for general adult medical examination without abnormal findings: Secondary | ICD-10-CM | POA: Diagnosis not present

## 2024-03-01 DIAGNOSIS — Z833 Family history of diabetes mellitus: Secondary | ICD-10-CM | POA: Diagnosis not present

## 2024-03-01 DIAGNOSIS — Z1330 Encounter for screening examination for mental health and behavioral disorders, unspecified: Secondary | ICD-10-CM | POA: Diagnosis not present

## 2024-03-01 DIAGNOSIS — Z87891 Personal history of nicotine dependence: Secondary | ICD-10-CM | POA: Diagnosis not present

## 2024-03-01 DIAGNOSIS — Z1211 Encounter for screening for malignant neoplasm of colon: Secondary | ICD-10-CM | POA: Diagnosis not present

## 2024-03-01 DIAGNOSIS — E7800 Pure hypercholesterolemia, unspecified: Secondary | ICD-10-CM | POA: Diagnosis not present

## 2024-03-01 DIAGNOSIS — F419 Anxiety disorder, unspecified: Secondary | ICD-10-CM | POA: Diagnosis not present

## 2024-03-01 DIAGNOSIS — Z79899 Other long term (current) drug therapy: Secondary | ICD-10-CM | POA: Diagnosis not present

## 2024-03-23 DIAGNOSIS — M25562 Pain in left knee: Secondary | ICD-10-CM | POA: Diagnosis not present

## 2024-03-24 DIAGNOSIS — Z419 Encounter for procedure for purposes other than remedying health state, unspecified: Secondary | ICD-10-CM | POA: Diagnosis not present

## 2024-04-02 ENCOUNTER — Emergency Department: Payer: Self-pay

## 2024-04-02 ENCOUNTER — Encounter: Payer: Self-pay | Admitting: Emergency Medicine

## 2024-04-02 ENCOUNTER — Other Ambulatory Visit: Payer: Self-pay

## 2024-04-02 ENCOUNTER — Emergency Department
Admission: EM | Admit: 2024-04-02 | Discharge: 2024-04-03 | Disposition: A | Discharge status: Home / Self Care | Payer: Self-pay | Attending: Emergency Medicine | Admitting: Emergency Medicine

## 2024-04-02 DIAGNOSIS — Z7901 Long term (current) use of anticoagulants: Secondary | ICD-10-CM | POA: Insufficient documentation

## 2024-04-02 DIAGNOSIS — R002 Palpitations: Secondary | ICD-10-CM | POA: Diagnosis not present

## 2024-04-02 DIAGNOSIS — R42 Dizziness and giddiness: Secondary | ICD-10-CM | POA: Insufficient documentation

## 2024-04-02 DIAGNOSIS — E876 Hypokalemia: Secondary | ICD-10-CM | POA: Insufficient documentation

## 2024-04-02 DIAGNOSIS — I10 Essential (primary) hypertension: Secondary | ICD-10-CM | POA: Diagnosis not present

## 2024-04-02 DIAGNOSIS — R0602 Shortness of breath: Secondary | ICD-10-CM | POA: Insufficient documentation

## 2024-04-02 LAB — CBC
HCT: 39.5 % (ref 36.0–46.0)
Hemoglobin: 13.7 g/dL (ref 12.0–15.0)
MCH: 30.4 pg (ref 26.0–34.0)
MCHC: 34.7 g/dL (ref 30.0–36.0)
MCV: 87.8 fL (ref 80.0–100.0)
Platelets: 244 10*3/uL (ref 150–400)
RBC: 4.5 MIL/uL (ref 3.87–5.11)
RDW: 11.9 % (ref 11.5–15.5)
WBC: 7.6 10*3/uL (ref 4.0–10.5)
nRBC: 0 % (ref 0.0–0.2)

## 2024-04-02 LAB — BASIC METABOLIC PANEL WITH GFR
Anion gap: 8 (ref 5–15)
BUN: 8 mg/dL (ref 6–20)
CO2: 24 mmol/L (ref 22–32)
Calcium: 8.9 mg/dL (ref 8.9–10.3)
Chloride: 103 mmol/L (ref 98–111)
Creatinine, Ser: 0.84 mg/dL (ref 0.44–1.00)
GFR, Estimated: >60 mL/min (ref >60)
Glucose, Bld: 167 mg/dL — ABNORMAL HIGH (ref 70–99)
Potassium: 3.2 mmol/L — ABNORMAL LOW (ref 3.5–5.1)
Sodium: 135 mmol/L (ref 135–145)

## 2024-04-02 LAB — TROPONIN I (HIGH SENSITIVITY): Troponin I (High Sensitivity): 5 ng/L (ref <18)

## 2024-04-02 LAB — D-DIMER, QUANTITATIVE: D-Dimer, Quant: 0.46 ug{FEU}/mL (ref 0.00–0.50)

## 2024-04-02 MED ORDER — POTASSIUM CHLORIDE CRYS ER 20 MEQ PO TBCR
40.0000 meq | EXTENDED_RELEASE_TABLET | Freq: Once | ORAL | Status: AC
  Administered 2024-04-02: 40 meq via ORAL
  Filled 2024-04-02: qty 2

## 2024-04-02 NOTE — ED Triage Notes (Signed)
 Pt c/o sweating and SOB with anxiety. She notes that her eyes hurt and she felt like her BP was elevated.

## 2024-04-02 NOTE — ED Provider Notes (Signed)
 [facility] Provider Note    Event Date/Time   First MD Initiated Contact with Patient 04/02/24 2308     (approximate)   History   Shortness of Breath   HPI  Mariah Parker is a 58 y.o. female with history of hypertension, hyperlipidemia, anxiety who presents to the emergency department with concerns that she may have had a panic attack.  Reports she has never had a panic attack before.  States she suddenly got short of breath, became hot, lightheaded and started sweating.  She states her feet felt numb and she had a headache.  No chest pain.  She is now asymptomatic.  States symptoms came on when she was trying to find something to eat.  She does not remember feeling anxious about anything.  She did feel like her heart was racing.  No vomiting, diarrhea, bloody stools or melena.  No history of PE or DVT.  No calf tenderness or calf swelling.   History provided by patient, daughter.    Past Medical History:  Diagnosis Date   Hyperlipidemia    Hypertension     Past Surgical History:  Procedure Laterality Date   CERVICAL SPINE SURGERY      MEDICATIONS:  Prior to Admission medications   Medication Sig Start Date End Date Taking? Authorizing Provider  acetaminophen (TYLENOL) 325 MG tablet Take by mouth.    [provider]  amLODipine (NORVASC) 10 MG tablet Take 1 tablet by mouth daily. 08/21/21 03/26/23  [provider]  cyclobenzaprine  (FLEXERIL ) 10 MG tablet Take 1 tablet (10 mg total) by mouth 2 (two) times daily as needed for muscle spasms. 03/26/23   Mayer, Jodi R, NP  estradiol (CLIMARA - DOSED IN MG/24 HR) 0.05 mg/24hr patch Place 0.05 mg onto the skin once a week.    [provider]  naproxen  (NAPROSYN ) 500 MG tablet Take 1 tablet (500 mg total) by mouth 2 (two) times daily as needed (back pain). 03/27/23   Mayer, Jodi R, NP  potassium chloride  (KLOR-CON ) 10 MEQ tablet Take 10 mEq by mouth daily.    [provider]  predniSONE  (STERAPRED UNI-PAK 21 TAB) 10 MG (21) TBPK tablet Take by mouth daily. Take 6 tabs by mouth daily  for 1 day, then 5 tabs for 1 day, then 4 tabs for 1 day, then 3 tabs for 1 day, 2 tabs for 1 day, then 1 tab by mouth daily for 1 days 03/27/23   Mayer, Jodi R, NP  progesterone (PROMETRIUM) 100 MG capsule Take 1 capsule by mouth at bedtime. 02/16/23 02/16/24  [provider]  rosuvastatin (CRESTOR) 5 MG tablet Take by mouth. 08/21/21 03/26/23  [provider]    Physical Exam   Triage Vital Signs: ED Triage Vitals  Encounter Vitals Group     BP 04/02/24 2211 123/76     Girls Systolic BP Percentile --      Girls Diastolic BP Percentile --      Boys Systolic BP Percentile --      Boys Diastolic BP Percentile --      Pulse Rate 04/02/24 2211 (!) 103     Resp 04/02/24 2211 18     Temp 04/02/24 2211 97.8 F (36.6 C)     Temp Source 04/02/24 2211 Oral     SpO2 04/02/24 2211 97 %     Weight 04/02/24 2212 181 lb (82.1 kg)     Height 04/02/24 2212 5' 3 (1.6 m)  Head Circumference --      Peak Flow --      Pain Score 04/02/24 2212 0     Pain Loc --      Pain Education --      Exclude from Growth Chart --     Most recent vital signs: Vitals:   04/02/24 2211 04/02/24 2330  BP: 123/76 113/79  Pulse: (!) 103 75  Resp: 18 16  Temp: 97.8 F (36.6 C)   SpO2: 97% 100%    CONSTITUTIONAL: Alert, responds appropriately to questions. Well-appearing; well-nourished HEAD: Normocephalic, atraumatic EYES: Conjunctivae clear, pupils appear equal, sclera nonicteric ENT: normal nose; moist mucous membranes NECK: Supple, normal ROM CARD: RRR; S1 and S2 appreciated RESP: Normal chest excursion without splinting or tachypnea; breath sounds clear and equal bilaterally; no wheezes, no rhonchi, no rales, no hypoxia or respiratory distress, speaking full sentences ABD/GI: Non-distended; soft, non-tender, no rebound, no guarding, no peritoneal signs BACK: The  back appears normal EXT: Normal ROM in all joints; no deformity noted, no edema, no calf tenderness or calf swelling SKIN: Normal color for age and race; warm; no rash on exposed skin NEURO: Moves all extremities equally, normal speech, no facial asymmetry PSYCH: The patient's mood and manner are appropriate.   ED Results / Procedures / Treatments   LABS: (all labs ordered are listed, but only abnormal results are displayed) Labs Reviewed  BASIC METABOLIC PANEL WITH GFR - Abnormal; Notable for the following components:      Result Value   Potassium 3.2 (*)    Glucose, Bld 167 (*)    All other components within normal limits  CBC  MAGNESIUM  TSH  D-DIMER, QUANTITATIVE  TROPONIN I (HIGH SENSITIVITY)  TROPONIN I (HIGH SENSITIVITY)     EKG:  EKG Interpretation Date/Time:  Saturday April 02 2024 22:20:01 EDT Ventricular Rate:  94 PR Interval:  154 QRS Duration:  94 QT Interval:  370 QTC Calculation: 462 R Axis:   28  Text Interpretation: Normal sinus rhythm Nonspecific ST and T wave abnormality Abnormal ECG No previous ECGs available Confirmed by Neomi Neptune [phone]) on 04/03/2024 7:58:39 AM         RADIOLOGY: My personal review and interpretation of imaging: Chest x-ray clear.  I have personally reviewed all radiology reports.   US  Venous Img Lower Unilateral Left Result Date: 04/03/2024 CLINICAL DATA:  Left leg pain and swelling for 1 day EXAM: LEFT LOWER EXTREMITY VENOUS DOPPLER ULTRASOUND TECHNIQUE: Gray-scale sonography with graded compression, as well as color Doppler and duplex ultrasound were performed to evaluate the lower extremity deep venous systems from the level of the common femoral vein and including the common femoral, femoral, profunda femoral, popliteal and calf veins including the posterior tibial, peroneal and gastrocnemius veins when visible. The superficial great saphenous vein was also interrogated. Spectral Doppler was utilized to evaluate flow at  rest and with distal augmentation maneuvers in the common femoral, femoral and popliteal veins. COMPARISON:  None Available. FINDINGS: Contralateral Common Femoral Vein: Respiratory phasicity is normal and symmetric with the symptomatic side. No evidence of thrombus. Normal compressibility. Common Femoral Vein: No evidence of thrombus. Normal compressibility, respiratory phasicity and response to augmentation. Saphenofemoral Junction: No evidence of thrombus. Normal compressibility and flow on color Doppler imaging. Profunda Femoral Vein: No evidence of thrombus. Normal compressibility and flow on color Doppler imaging. Femoral Vein: No evidence of thrombus. Normal compressibility, respiratory phasicity and response to augmentation. Popliteal Vein: No evidence of thrombus. Normal compressibility, respiratory  phasicity and response to augmentation. Calf Veins: No evidence of thrombus. Normal compressibility and flow on color Doppler imaging. Superficial Great Saphenous Vein: No evidence of thrombus. Normal compressibility. Venous Reflux:  None. Other Findings:  None. IMPRESSION: No evidence of deep venous thrombosis. Electronically Signed   By: Oneil Devonshire M.D.   On: 04/03/2024 00:12   DG Chest 2 View Result Date: 04/02/2024 CLINICAL DATA:  Shortness of breath EXAM: CHEST - 2 VIEW COMPARISON:  06/03/2013 FINDINGS: The heart size and mediastinal contours are within normal limits. Both lungs are clear. The visualized skeletal structures are unremarkable. IMPRESSION: No active cardiopulmonary disease. Electronically Signed   By: Oneil Devonshire M.D.   On: 04/02/2024 22:37     PROCEDURES:  Critical Care performed: No      .1-3 Lead EKG Interpretation  Performed by: Vincenza Dail, Josette SAILOR, DO Authorized by: Cleora Karnik, Josette SAILOR, DO     Interpretation: normal     ECG rate:  75   ECG rate assessment: normal     Rhythm: sinus rhythm     Ectopy: none     Conduction: normal       IMPRESSION / MDM / ASSESSMENT  AND PLAN / ED COURSE  I reviewed the triage vital signs and the nursing notes.    Patient here with shortness of breath, diaphoresis, lightheadedness, palpitations.  The patient is on the cardiac monitor to evaluate for evidence of arrhythmia and/or significant heart rate changes.   DIFFERENTIAL DIAGNOSIS (includes but not limited to):   ACS, arrhythmia, PE, anemia, electrolyte derangement, thyroid dysfunction, dehydration, anxiety   Patient's presentation is most consistent with acute presentation with potential threat to life or bodily function.   PLAN: Will obtain cardiac labs, chest x-ray.  EKG shows nonspecific T wave abnormalities with no old for comparison.  Patient currently asymptomatic.  Will place on cardiac monitoring.   MEDICATIONS GIVEN IN ED: Medications  potassium chloride  SA (KLOR-CON  M) CR tablet 40 mEq (40 mEq Oral Given 04/02/24 2341)     ED COURSE: Patient has normal hemoglobin.  Potassium of 3.2.  Given oral replacement.  Normal magnesium.  Normal TSH.  Negative troponin x 2.  Negative D-dimer.  Chest x-ray reviewed and interpreted by myself and the radiologist and shows no acute abnormality.  No evidence seen on cardiac monitoring.  Patient feeling better.  I feel she is safe for discharge.  Will refer to cardiology in case she has return of the symptoms for potential Holter monitoring, echocardiogram.   At this time, I do not feel there is any life-threatening condition present. I reviewed all nursing notes, vitals, pertinent previous records.  All lab and urine results, EKGs, imaging ordered have been independently reviewed and interpreted by myself.  I reviewed all available radiology reports from any imaging ordered this visit.  Based on my assessment, I feel the patient is safe to be discharged home without further emergent workup and can continue workup as an outpatient as needed. Discussed all findings, treatment plan as well as usual and customary return  precautions.  They verbalize understanding and are comfortable with this plan.  Outpatient follow-up has been provided as needed.  All questions have been answered.    CONSULTS: Admission considered but patient feeling better, asymptomatic with reassuring workup.  I feel she is safe for outpatient management.   OUTSIDE RECORDS REVIEWED: Reviewed last family medicine note on 03/01/2024.       FINAL CLINICAL IMPRESSION(S) / ED DIAGNOSES   Final diagnoses:  Palpitations  SOB (shortness of breath)     Rx / DC Orders   ED Discharge Orders          Ordered    Ambulatory referral to Cardiology       Comments: If you have not heard from the Cardiology office within the next 72 hours please call [phone].   04/03/24 0131             Note:  This document was prepared using Dragon voice recognition software and may include unintentional dictation errors.   Serenity Batley, Josette SAILOR, DO 04/03/24 [phone]

## 2024-04-03 LAB — MAGNESIUM: Magnesium: 2.1 mg/dL (ref 1.7–2.4)

## 2024-04-03 LAB — TROPONIN I (HIGH SENSITIVITY): Troponin I (High Sensitivity): 5 ng/L (ref <18)

## 2024-04-03 LAB — TSH: TSH: 0.977 u[IU]/mL (ref 0.350–4.500)

## 2024-04-05 DIAGNOSIS — M1712 Unilateral primary osteoarthritis, left knee: Secondary | ICD-10-CM | POA: Diagnosis not present

## 2024-04-05 DIAGNOSIS — M25562 Pain in left knee: Secondary | ICD-10-CM | POA: Diagnosis not present

## 2024-04-08 DIAGNOSIS — E876 Hypokalemia: Secondary | ICD-10-CM | POA: Diagnosis not present

## 2024-04-08 DIAGNOSIS — F410 Panic disorder [episodic paroxysmal anxiety] without agoraphobia: Secondary | ICD-10-CM | POA: Diagnosis not present

## 2024-04-08 DIAGNOSIS — M1712 Unilateral primary osteoarthritis, left knee: Secondary | ICD-10-CM | POA: Diagnosis not present

## 2024-04-08 DIAGNOSIS — F4323 Adjustment disorder with mixed anxiety and depressed mood: Secondary | ICD-10-CM | POA: Diagnosis not present

## 2024-04-23 DIAGNOSIS — Z419 Encounter for procedure for purposes other than remedying health state, unspecified: Secondary | ICD-10-CM | POA: Diagnosis not present

## 2024-05-24 DIAGNOSIS — Z419 Encounter for procedure for purposes other than remedying health state, unspecified: Secondary | ICD-10-CM | POA: Diagnosis not present

## 2024-06-02 ENCOUNTER — Ambulatory Visit: Admitting: Cardiology

## 2024-06-11 DIAGNOSIS — E042 Nontoxic multinodular goiter: Secondary | ICD-10-CM | POA: Diagnosis not present

## 2024-06-16 NOTE — Progress Notes (Unsigned)
 Cardiology Office Note    Date:  06/17/2024   ID:  Mariah Parker, DOB 06-Feb-1966, MRN [id number]  PCP:  [facility]  Cardiologist:  None  Electrophysiologist:  None   Chief Complaint: ED follow-up  History of Present Illness:   Mariah Parker is a 58 y.o. female with history of HTN, HLD, multinodular thyroid , and prior tobacco use at 1 pack/day for 10 years quitting in 2021 who presents for ED follow-up of palpitations and shortness of breath.  She has no previously known cardiac history.  She was seen in the ED in 03/2024 with sudden onset of shortness of breath, flushing, lightheadedness, palpitations, and sweating.  She was without symptoms of chest pain.  EKG shows sinus rhythm with nonspecific ST-T changes.  High-sensitivity troponin negative x 2.  D-dimer normal.  Chest x-ray without active cardiopulmonary disease.  Left lower extremity venous duplex negative for DVT.  No evidence of significant abnormality on cardiac monitoring.  She was discharged and referred to cardiology.  She comes in today and is without symptoms of angina or cardiac decompensation.  She has been without further symptoms since this started in 03/2024.  She reports she was sitting on her sofa watching TV in 03/2024 when she developed sudden onset of shortness of breath and palpitations without frank chest pain.  Symptoms lasted for 1 to 2 minutes and spontaneously resolved.  However, after this episode she reported waxing and waning sensation of feeling hot and cold prompting ED evaluation with workup as outlined above.  As previously noted, she has been without these symptoms since.  No dizziness, presyncope, or syncope.  No lower extremity swelling or progressive orthopnea.  Continues to abstain from tobacco use.  No alcohol or illicit substance use.  No family history of sudden cardiac death.  Currently without cardiac complaint.   Labs independently reviewed: 03/2024 - TSH normal, magnesium  2.1, Hgb 13.7, PLT 244, potassium 3.2, BUN 8, serum creatinine 0.84 02/2024 - A1c 5.9, albumin 4.0, AST/ALT normal, TC 126, TG 94, HDL 35, LDL 72  Past Medical History:  Diagnosis Date   Hyperlipidemia    Hypertension    Multinodular goiter     Past Surgical History:  Procedure Laterality Date   CERVICAL SPINE SURGERY      Current Medications: Current Meds  Medication Sig   acetaminophen (TYLENOL) 325 MG tablet Take by mouth.   amLODipine (NORVASC) 10 MG tablet Take 1 tablet by mouth daily.   cyclobenzaprine  (FLEXERIL ) 10 MG tablet Take 1 tablet (10 mg total) by mouth 2 (two) times daily as needed for muscle spasms.   estradiol (CLIMARA - DOSED IN MG/24 HR) 0.05 mg/24hr patch Place 0.05 mg onto the skin once a week.   naproxen  (NAPROSYN ) 500 MG tablet Take 1 tablet (500 mg total) by mouth 2 (two) times daily as needed (back pain).   potassium chloride  (KLOR-CON ) 10 MEQ tablet Take 10 mEq by mouth daily.   progesterone (PROMETRIUM) 100 MG capsule Take 1 capsule by mouth at bedtime.   rosuvastatin (CRESTOR) 5 MG tablet Take by mouth.   sertraline (ZOLOFT) 50 MG tablet Take 50 mg by mouth daily.    Allergies:   No known allergies   Social History   Socioeconomic History   Marital status: Single    Spouse name: Not on file   Number of children: Not on file   Years of education: Not on file   Highest education level: Not on file  Occupational History   Not on file  Tobacco Use   Smoking status: Former    Types: Cigarettes   Smokeless tobacco: Never  Vaping Use   Vaping status: Never Used  Substance and Sexual Activity   Alcohol use: Not Currently   Drug use: Not Currently   Sexual activity: Not on file  Other Topics Concern   Not on file  Social History Narrative   Not on file   Social Drivers of Health   Financial Resource Strain: Medium Risk (02/27/2024)   Received from [facility]   Overall Financial Resource Strain (CARDIA)    Difficulty of  Paying Living Expenses: Somewhat hard  Food Insecurity: Food Insecurity Present (02/27/2024)   Received from [facility]   Hunger Vital Sign    Within the past 12 months, you worried that your food would run out before you got the money to buy more.: Sometimes true    Within the past 12 months, the food you bought just didn't last and you didn't have money to get more.: Patient declined  Transportation Needs: No Transportation Needs (02/27/2024)   Received from [facility] - Transportation    In the past 12 months, has lack of transportation kept you from medical appointments or from getting medications?: No    Lack of Transportation (Non-Medical): No  Physical Activity: Not on file  Stress: Not on file  Social Connections: Not on file     Family History:  The patient's family history includes Diabetes in her mother; Hypertension in her mother; Thyroid  disease in her mother.  ROS:   12-point review of systems is negative unless otherwise noted in the HPI.   EKGs/Labs/Other Studies Reviewed:    Studies reviewed were summarized above. The additional studies were reviewed today: None available for review.  EKG:  EKG is ordered today.  The EKG ordered today demonstrates NSR, 70 bpm, no acute ST-T changes  Recent Labs: 04/02/2024: BUN 8; Creatinine, Ser 0.84; Hemoglobin 13.7; Magnesium 2.1; Platelets 244; Potassium 3.2; Sodium 135; TSH 0.977  Recent Lipid Panel No results found for: CHOL, TRIG, HDL, CHOLHDL, VLDL, LDLCALC, LDLDIRECT  PHYSICAL EXAM:    VS:  BP 134/84   Pulse 70   Ht 5' 3 (1.6 m)   Wt 184 lb 12.8 oz (83.8 kg)   SpO2 97%   BMI 32.74 kg/m   BMI: Body mass index is 32.74 kg/m.  Physical Exam Vitals reviewed.  Constitutional:      Appearance: She is well-developed.  HENT:     Head: Normocephalic and atraumatic.  Eyes:     General:        Right eye: No discharge.        Left eye: No discharge.   Cardiovascular:     Rate and Rhythm: Normal rate and regular rhythm.     Pulses:          Posterior tibial pulses are 2+ on the right side and 2+ on the left side.     Heart sounds: Normal heart sounds, S1 normal and S2 normal. Heart sounds not distant. No midsystolic click and no opening snap. No murmur heard.    No friction rub.  Pulmonary:     Effort: Pulmonary effort is normal. No respiratory distress.     Breath sounds: Normal breath sounds. No decreased breath sounds, wheezing, rhonchi or rales.  Musculoskeletal:     Cervical back: Normal range of motion.  Right lower leg: No edema.     Left lower leg: No edema.  Skin:    General: Skin is warm and dry.     Nails: There is no clubbing.  Neurological:     Mental Status: She is alert and oriented to person, place, and time.  Psychiatric:        Speech: Speech normal.        Behavior: Behavior normal.        Thought Content: Thought content normal.        Judgment: Judgment normal.     Wt Readings from Last 3 Encounters:  06/17/24 184 lb 12.8 oz (83.8 kg)  04/02/24 181 lb (82.1 kg)  01/31/22 164 lb 14.5 oz (74.8 kg)     ASSESSMENT & PLAN:   Dyspnea: Isolated episode in 03/2024.  None since.  No exertional symptoms.  Obtain echo to evaluate for significant structural abnormality.  Obtain Lexiscan MPI to evaluate for high risk ischemia.  Escalate pharmacotherapy as indicated  Palpitations: Isolated episode in 03/2024.  None since.  ZIO XT.  Obtain echo to evaluate for structural abnormality.  HTN: Blood pressure is reasonably controlled in the office today.  Remains on amlodipine 10 mg.  HLD: LDL 72 in 02/2024.  Remains on rosuvastatin 5 mg.  Followed by PCP.   Informed Consent   Shared Decision Making/Informed Consent{  The risks [chest pain, shortness of breath, cardiac arrhythmias, dizziness, blood pressure fluctuations, myocardial infarction, stroke/transient ischemic attack, nausea, vomiting, allergic reaction,  radiation exposure, metallic taste sensation and life-threatening complications (estimated to be 1 in 10,000)], benefits (risk stratification, diagnosing coronary artery disease, treatment guidance) and alternatives of a nuclear stress test were discussed in detail with Ms. Pedigo and she agrees to proceed.       Disposition: F/u with me in 2 months.   Medication Adjustments/Labs and Tests Ordered: Current medicines are reviewed at length with the patient today.  Concerns regarding medicines are outlined above. Medication changes, Labs and Tests ordered today are summarized above and listed in the Patient Instructions accessible in Encounters.   SignedBernardino Bring, PA-C 06/17/2024 12:07 PM     [facility] [place] [address] [place], [place] [address] [phone]

## 2024-06-17 ENCOUNTER — Ambulatory Visit (INDEPENDENT_AMBULATORY_CARE_PROVIDER_SITE_OTHER)

## 2024-06-17 ENCOUNTER — Encounter: Payer: Self-pay | Admitting: Physician Assistant

## 2024-06-17 ENCOUNTER — Ambulatory Visit: Attending: Physician Assistant | Admitting: Physician Assistant

## 2024-06-17 VITALS — BP 134/84 | HR 70 | Ht 63.0 in | Wt 184.8 lb

## 2024-06-17 DIAGNOSIS — R002 Palpitations: Secondary | ICD-10-CM

## 2024-06-17 DIAGNOSIS — I10 Essential (primary) hypertension: Secondary | ICD-10-CM | POA: Insufficient documentation

## 2024-06-17 DIAGNOSIS — R0602 Shortness of breath: Secondary | ICD-10-CM | POA: Insufficient documentation

## 2024-06-17 DIAGNOSIS — E782 Mixed hyperlipidemia: Secondary | ICD-10-CM | POA: Insufficient documentation

## 2024-06-17 NOTE — Patient Instructions (Signed)
 Medication Instructions:  Your physician recommends that you continue on your current medications as directed. Please refer to the Current Medication list given to you today.   *If you need a refill on your cardiac medications before your next appointment, please call your pharmacy*  Lab Work: None ordered at this time  If you have labs (blood work) drawn today and your tests are completely normal, you will receive your results only by: MyChart Message (if you have MyChart) OR A paper copy in the mail If you have any lab test that is abnormal or we need to change your treatment, we will call you to review the results.  Testing/Procedures: Your physician has requested that you have an Echocardiogram. Echocardiography is a painless test that uses sound waves to create images of your heart. It provides your doctor with information about the size and shape of your heart and how well your heart's chambers and valves are working.   You may receive an ultrasound enhancing agent through an IV if needed to better visualize your heart during the echo. This procedure takes approximately one hour.  There are no restrictions for this procedure.  This will take place at [address] [facility] [address]) [address], [place] [address]  Please note: We ask at that you not bring children with you during ultrasound (echo/ vascular) testing. Due to room size and safety concerns, children are not allowed in the ultrasound rooms during exams. Our front office staff cannot provide observation of children in our lobby area while testing is being conducted. An adult accompanying a patient to their appointment will only be allowed in the ultrasound room at the discretion of the ultrasound technician under special circumstances. We apologize for any inconvenience.   Your provider has ordered a Lexiscan/ Exercise Myoview Stress test. This will take place at [facility]. Please report to the [facility] medical mall entrance. The  volunteers at the first desk will direct you where to go.  [facility] MYOVIEW  Your provider has ordered a Stress Test with nuclear imaging. The purpose of this test is to evaluate the blood supply to your heart muscle. This procedure is referred to as a Non-Invasive Stress Test. This is because other than having an IV started in your vein, nothing is inserted or invades your body. Cardiac stress tests are done to find areas of poor blood flow to the heart by determining the extent of coronary artery disease (CAD). Some patients exercise on a treadmill, which naturally increases the blood flow to your heart, while others who are unable to walk on a treadmill due to physical limitations will have a pharmacologic/chemical stress agent called Lexiscan . This medicine will mimic walking on a treadmill by temporarily increasing your coronary blood flow.   Please note: these test may take anywhere between 2-4 hours to complete  How to prepare for your Myoview test:  Nothing to eat for 6 hours prior to the test No caffeine for 24 hours prior to test No smoking 24 hours prior to test. Your medication may be taken with water.  If your doctor stopped a medication because of this test, do not take that medication. Ladies, please do not wear dresses.  Skirts or pants are appropriate. Please wear a short sleeve shirt. No perfume, cologne or lotion. Wear comfortable walking shoes. No heels!   PLEASE NOTIFY THE OFFICE AT LEAST 24 HOURS IN ADVANCE IF YOU ARE UNABLE TO KEEP YOUR APPOINTMENT.  [phone] AND  PLEASE NOTIFY NUCLEAR MEDICINE  AT [facility] AT LEAST 24 HOURS IN ADVANCE IF YOU ARE UNABLE TO KEEP YOUR APPOINTMENT. [phone]    Your physician has recommended that you wear a Zio monitor.   This monitor is a medical device that records the heart's electrical activity. Doctors most often use these monitors to diagnose arrhythmias. Arrhythmias are problems with the speed or rhythm of the heartbeat. The  monitor is a small device applied to your chest. You can wear one while you do your normal daily activities. While wearing this monitor if you have any symptoms to push the button and record what you felt. Once you have worn this monitor for the period of time provider prescribed (Usually 14 days), you will return the monitor device in the postage paid box. Once it is returned they will download the data collected and provide us  with a report which the provider will then review and we will call you with those results. Important tips:  Avoid showering during the first 24 hours of wearing the monitor. Avoid excessive sweating to help maximize wear time. Do not submerge the device, no hot tubs, and no swimming pools. Keep any lotions or oils away from the patch. After 24 hours you may shower with the patch on. Take brief showers with your back facing the shower head.  Do not remove patch once it has been placed because that will interrupt data and decrease adhesive wear time. Push the button when you have any symptoms and write down what you were feeling. Once you have completed wearing your monitor, remove and place into box which has postage paid and place in your outgoing mailbox.  If for some reason you have misplaced your box then call our office and we can provide another box and/or mail it off for you.   Follow-Up: At [facility], you and your health needs are our priority.  As part of our continuing mission to provide you with exceptional heart care, our providers are all part of one team.  This team includes your primary Cardiologist (physician) and Advanced Practice Providers or APPs (Physician Assistants and Nurse Practitioners) who all work together to provide you with the care you need, when you need it.  Your next appointment:   2 month(s)  Provider:   You may see one of the following Advanced Practice Providers on your designated Care Team:   Lonni Meager, NP Lesley Maffucci, PA-C Bernardino Bring, PA-C Cadence Odell, PA-C Tylene Lunch, NP Barnie Hila, NP    We recommend signing up for the patient portal called MyChart.  Sign up information is provided on this After Visit Summary.  MyChart is used to connect with patients for Virtual Visits (Telemedicine).  Patients are able to view lab/test results, encounter notes, upcoming appointments, etc.  Non-urgent messages can be sent to your provider as well.   To learn more about what you can do with MyChart, go to [email or web address].

## 2024-06-24 DIAGNOSIS — Z419 Encounter for procedure for purposes other than remedying health state, unspecified: Secondary | ICD-10-CM | POA: Diagnosis not present

## 2024-06-27 ENCOUNTER — Ambulatory Visit
Admission: RE | Admit: 2024-06-27 | Discharge: 2024-06-27 | Disposition: A | Discharge status: Home / Self Care | Source: Ambulatory Visit | Attending: Physician Assistant | Admitting: Physician Assistant

## 2024-06-27 ENCOUNTER — Other Ambulatory Visit: Payer: Self-pay | Admitting: Physician Assistant

## 2024-06-27 DIAGNOSIS — R0602 Shortness of breath: Secondary | ICD-10-CM | POA: Insufficient documentation

## 2024-06-27 LAB — NM MYOCAR MULTI W/SPECT W/WALL MOTION / EF
LV dias vol: 79 mL (ref 46–106)
LV sys vol: 37 mL (ref 3.8–5.2)
MPHR: 162 {beats}/min
Nuc Stress EF: 53 %
Peak HR: 90 {beats}/min
Percent HR: 55 %
Rest HR: 62 {beats}/min
Rest Nuclear Isotope Dose: 11 mCi
SDS: 2
SRS: 2
SSS: 2
ST Depression (mm): 0 mm
Stress Nuclear Isotope Dose: 32.8 mCi
TID: 1.13

## 2024-06-27 MED ORDER — TECHNETIUM TC 99M TETROFOSMIN IV KIT
32.7500 | PACK | Freq: Once | INTRAVENOUS | Status: AC | PRN
  Administered 2024-06-27: 32.75 via INTRAVENOUS

## 2024-06-27 MED ORDER — REGADENOSON 0.4 MG/5ML IV SOLN
0.4000 mg | Freq: Once | INTRAVENOUS | Status: AC
  Administered 2024-06-27: 0.4 mg via INTRAVENOUS

## 2024-06-27 MED ORDER — TECHNETIUM TC 99M TETROFOSMIN IV KIT
10.9500 | PACK | Freq: Once | INTRAVENOUS | Status: AC | PRN
  Administered 2024-06-27: 10.95 via INTRAVENOUS

## 2024-06-27 NOTE — Progress Notes (Signed)
     Mariah Parker presented for a nuclear stress test today.  I Lesley LITTIE Maffucci, PA-C, provided direct supervision and was present during the stress portion of the study today, which was completed without significant symptoms, immediate complications, or acute ST/T changes on ECG.  Stress imaging is pending at this time.  Preliminary ECG findings may be listed in the chart, but the stress test result will not be finalized until perfusion imaging is complete.  Lesley LITTIE Maffucci, PA-C  06/27/2024, 10:31 AM

## 2024-06-28 ENCOUNTER — Ambulatory Visit: Payer: Self-pay | Admitting: Physician Assistant

## 2024-07-13 DIAGNOSIS — E042 Nontoxic multinodular goiter: Secondary | ICD-10-CM | POA: Diagnosis not present

## 2024-07-21 DIAGNOSIS — R002 Palpitations: Secondary | ICD-10-CM

## 2024-07-25 ENCOUNTER — Ambulatory Visit

## 2024-08-17 NOTE — Progress Notes (Deleted)
 Cardiology Office Note    Date:  08/17/2024   ID:  Mariah Parker, DOB 1966-08-09, MRN [id number]  PCP:  [facility]  Cardiologist:  None  Electrophysiologist:  None   Chief Complaint: Follow up  History of Present Illness:   Mariah Parker is a 58 y.o. female with history of PSVT, HTN, HLD, multinodular thyroid , and prior tobacco use at 1 pack/day for 10 years quitting in 2021 who presents for follow up of Zio patch and Lexiscan  MPI.  She was seen as part of heart first clinic on 06/17/2024.  She had previously been seen in the ED in 03/2024 with sudden onset of shortness of breath, flushing, lightheadedness, palpitations, and sweating.  She was without symptoms of chest pain.  EKG showed sinus rhythm with nonspecific ST-T changes.  High-sensitivity troponin negative x 2.  D-dimer normal.  Chest x-ray without cardiopulmonary disease.  Left lower extremity venous duplex negative for DVT.  No significant abnormality noted on cardiac monitoring.  At time of cardiology evaluation in 06/2024, she had been without further symptoms and reported the above episode lasted for 1 to 2 minutes with spontaneous resolution.  Zio patch in 06/2024 showed a predominant rhythm of sinus with an average rate of 80 bpm, 6 episodes of SVT lasting up to 11.5 seconds and rare atrial and ventricular ectopy.  Lexiscan  MPI in 06/2024 was low risk without evidence of ischemia or infarction with an EF of 53%.  There was no evidence of coronary artery calcification noted on CT attenuation imaging.  Echo is scheduled for 09/02/2024.   ***   Labs independently reviewed: 03/2024 - TSH normal, magnesium 2.1, Hgb 13.7, PLT 244, potassium 3.2, BUN 8, serum creatinine 0.84 02/2024 - A1c 5.9, albumin 4.0, AST/ALT normal, TC 126, TG 94, HDL 35, LDL 72  Past Medical History:  Diagnosis Date   Hyperlipidemia    Hypertension    Multinodular goiter     Past Surgical History:  Procedure Laterality Date    CERVICAL SPINE SURGERY      Current Medications: No outpatient medications have been marked as taking for the 08/18/24 encounter (Appointment) with Abigail Bernardino HERO, PA-C.    Allergies:   No known allergies   Social History   Socioeconomic History   Marital status: Single    Spouse name: Not on file   Number of children: Not on file   Years of education: Not on file   Highest education level: Not on file  Occupational History   Not on file  Tobacco Use   Smoking status: Former    Types: Cigarettes   Smokeless tobacco: Never  Vaping Use   Vaping status: Never Used  Substance and Sexual Activity   Alcohol use: Not Currently   Drug use: Not Currently   Sexual activity: Not on file  Other Topics Concern   Not on file  Social History Narrative   Not on file   Social Drivers of Health   Financial Resource Strain: Medium Risk (02/27/2024)   Received from [facility]   Overall Financial Resource Strain (CARDIA)    Difficulty of Paying Living Expenses: Somewhat hard  Food Insecurity: Food Insecurity Present (02/27/2024)   Received from [facility]   Hunger Vital Sign    Within the past 12 months, you worried that your food would run out before you got the money to buy more.: Sometimes true    Within the past 12 months, the  food you bought just didn't last and you didn't have money to get more.: Patient declined  Transportation Needs: No Transportation Needs (02/27/2024)   Received from [facility] - Transportation    In the past 12 months, has lack of transportation kept you from medical appointments or from getting medications?: No    Lack of Transportation (Non-Medical): No  Physical Activity: Not on file  Stress: Not on file  Social Connections: Not on file     Family History:  The patient's family history includes Diabetes in her mother; Hypertension in her mother; Thyroid  disease in her mother.  ROS:    12-point review of systems is negative unless otherwise noted in the HPI.   EKGs/Labs/Other Studies Reviewed:    Studies reviewed were summarized above. The additional studies were reviewed today:  Zio patch 06/2024: Patient had a min HR of 54 bpm, max HR of 185 bpm, and avg HR of 80 bpm. Predominant underlying rhythm was Sinus Rhythm.  6 Supraventricular Tachycardia runs occurred, the run with the fastest interval lasting 11.5 secs with a max rate of 185 bpm (avg 154 bpm); the run with the fastest interval was also the longest. Rare PACs and rare PVCs. __________  Lexiscan  MPI 06/27/2024:   The study is normal. The study is low risk.   No ST deviation was noted.   LV perfusion is normal. There is no evidence of ischemia. There is no evidence of infarction.   Left ventricular function is normal. Nuclear stress EF: 53%. End diastolic cavity size is normal. End systolic cavity size is normal.   Coronary calcium was absent on the attenuation correction CT images.    EKG:  EKG is ordered today.  The EKG ordered today demonstrates ***  Recent Labs: 04/02/2024: BUN 8; Creatinine, Ser 0.84; Hemoglobin 13.7; Magnesium 2.1; Platelets 244; Potassium 3.2; Sodium 135; TSH 0.977  Recent Lipid Panel No results found for: CHOL, TRIG, HDL, CHOLHDL, VLDL, LDLCALC, LDLDIRECT  PHYSICAL EXAM:    VS:  There were no vitals taken for this visit.  BMI: There is no height or weight on file to calculate BMI.  Physical Exam  Wt Readings from Last 3 Encounters:  06/17/24 184 lb 12.8 oz (83.8 kg)  04/02/24 181 lb (82.1 kg)  01/31/22 164 lb 14.5 oz (74.8 kg)     ASSESSMENT & PLAN:   ***  PSVT/palpitations:  HTN: Blood pressure  HLD: LDL 72 in 02/2024.    {Are you ordering a CV Procedure (e.g. stress test, cath, DCCV, TEE, etc)?   Press F2        :[id number]}     Disposition: F/u with Dr. PIERRETTE or an APP in ***.   Medication Adjustments/Labs and Tests Ordered: Current medicines  are reviewed at length with the patient today.  Concerns regarding medicines are outlined above. Medication changes, Labs and Tests ordered today are summarized above and listed in the Patient Instructions accessible in Encounters.   Signed, Bernardino Bring, PA-C 08/17/2024 9:02 AM     [facility] [place] [address] [place], [place] [address] [phone]

## 2024-08-18 ENCOUNTER — Ambulatory Visit: Attending: Physician Assistant | Admitting: Physician Assistant

## 2024-08-18 DIAGNOSIS — E782 Mixed hyperlipidemia: Secondary | ICD-10-CM

## 2024-08-18 DIAGNOSIS — R002 Palpitations: Secondary | ICD-10-CM

## 2024-08-18 DIAGNOSIS — I10 Essential (primary) hypertension: Secondary | ICD-10-CM

## 2024-08-18 DIAGNOSIS — I471 Supraventricular tachycardia, unspecified: Secondary | ICD-10-CM

## 2024-09-02 ENCOUNTER — Encounter (HOSPITAL_COMMUNITY): Payer: Self-pay

## 2024-09-02 ENCOUNTER — Ambulatory Visit (HOSPITAL_COMMUNITY): Admission: RE | Admit: 2024-09-02 | Source: Ambulatory Visit

## 2024-09-23 DIAGNOSIS — Z419 Encounter for procedure for purposes other than remedying health state, unspecified: Secondary | ICD-10-CM | POA: Diagnosis not present

## 2024-10-04 DIAGNOSIS — Z23 Encounter for immunization: Secondary | ICD-10-CM | POA: Diagnosis not present

## 2024-10-11 ENCOUNTER — Telehealth: Payer: Self-pay | Admitting: Emergency Medicine

## 2024-10-11 NOTE — Telephone Encounter (Signed)
 Call (VM) and [facility] message to pt to get echo (3rd attempt) and f/u scheduled

## 2024-11-15 ENCOUNTER — Ambulatory Visit

## 2024-11-15 DIAGNOSIS — R0602 Shortness of breath: Secondary | ICD-10-CM | POA: Diagnosis not present

## 2024-11-15 LAB — ECHOCARDIOGRAM COMPLETE
AR max vel: 2.12 cm2
AV Area VTI: 2.11 cm2
AV Area mean vel: 1.88 cm2
AV Mean grad: 5 mmHg
AV Peak grad: 9 mmHg
Ao pk vel: 1.5 m/s
Area-P 1/2: 3.77 cm2
S' Lateral: 2.9 cm

## 2024-11-18 ENCOUNTER — Ambulatory Visit: Admitting: Physician Assistant

## 2024-11-18 ENCOUNTER — Encounter: Payer: Self-pay | Admitting: Physician Assistant

## 2024-11-18 VITALS — BP 128/76 | HR 72 | Ht 63.0 in | Wt 180.8 lb

## 2024-11-18 DIAGNOSIS — R002 Palpitations: Secondary | ICD-10-CM

## 2024-11-18 DIAGNOSIS — R0602 Shortness of breath: Secondary | ICD-10-CM

## 2024-11-18 DIAGNOSIS — I471 Supraventricular tachycardia, unspecified: Secondary | ICD-10-CM

## 2024-11-18 DIAGNOSIS — E782 Mixed hyperlipidemia: Secondary | ICD-10-CM

## 2024-11-18 DIAGNOSIS — I10 Essential (primary) hypertension: Secondary | ICD-10-CM

## 2024-11-18 NOTE — Patient Instructions (Signed)
 Medication Instructions:  Your physician recommends that you continue on your current medications as directed. Please refer to the Current Medication list given to you today.   *If you need a refill on your cardiac medications before your next appointment, please call your pharmacy*  Lab Work: NONE If you have labs (blood work) drawn today and your tests are completely normal, you will receive your results only by: MyChart Message (if you have MyChart) OR A paper copy in the mail If you have any lab test that is abnormal or we need to change your treatment, we will call you to review the results.  Testing/Procedures: NONE  Follow-Up: At [facility], you and your health needs are our priority.  As part of our continuing mission to provide you with exceptional heart care, our providers are all part of one team.  This team includes your primary Cardiologist (physician) and Advanced Practice Providers or APPs (Physician Assistants and Nurse Practitioners) who all work together to provide you with the care you need, when you need it.  Your next appointment:   6 month(s)  Provider:   Bernardino Bring, PA-C
